# Patient Record
Sex: Female | Born: 1955
Health system: Southern US, Community
[De-identification: ages and names within clinical notes are randomized; demographics above are authoritative.]

## PROBLEM LIST (undated history)

## (undated) DIAGNOSIS — N949 Unspecified condition associated with female genital organs and menstrual cycle: Secondary | ICD-10-CM

## (undated) DIAGNOSIS — K259 Gastric ulcer, unspecified as acute or chronic, without hemorrhage or perforation: Secondary | ICD-10-CM

## (undated) DIAGNOSIS — M1990 Unspecified osteoarthritis, unspecified site: Secondary | ICD-10-CM

## (undated) DIAGNOSIS — K219 Gastro-esophageal reflux disease without esophagitis: Secondary | ICD-10-CM

## (undated) DIAGNOSIS — N925 Other specified irregular menstruation: Secondary | ICD-10-CM

## (undated) DIAGNOSIS — K449 Diaphragmatic hernia without obstruction or gangrene: Secondary | ICD-10-CM

## (undated) DIAGNOSIS — K222 Esophageal obstruction: Secondary | ICD-10-CM

## (undated) DIAGNOSIS — K589 Irritable bowel syndrome without diarrhea: Secondary | ICD-10-CM

## (undated) DIAGNOSIS — K52832 Lymphocytic colitis: Secondary | ICD-10-CM

## (undated) DIAGNOSIS — N938 Other specified abnormal uterine and vaginal bleeding: Secondary | ICD-10-CM

## (undated) DIAGNOSIS — R7303 Prediabetes: Secondary | ICD-10-CM

## (undated) DIAGNOSIS — N83209 Unspecified ovarian cyst, unspecified side: Secondary | ICD-10-CM

## (undated) DIAGNOSIS — E559 Vitamin D deficiency, unspecified: Secondary | ICD-10-CM

## (undated) DIAGNOSIS — J309 Allergic rhinitis, unspecified: Secondary | ICD-10-CM

## (undated) DIAGNOSIS — M722 Plantar fascial fibromatosis: Secondary | ICD-10-CM

## (undated) DIAGNOSIS — M549 Dorsalgia, unspecified: Secondary | ICD-10-CM

## (undated) HISTORY — DX: Unspecified condition associated with female genital organs and menstrual cycle: N94.9

## (undated) HISTORY — DX: Gastric ulcer, unspecified as acute or chronic, without hemorrhage or perforation: K25.9

## (undated) HISTORY — DX: Allergic rhinitis, unspecified: J30.9

## (undated) HISTORY — DX: Lymphocytic colitis: K52.832

## (undated) HISTORY — DX: Gastro-esophageal reflux disease without esophagitis: K21.9

## (undated) HISTORY — DX: Other specified irregular menstruation: N92.5

## (undated) HISTORY — DX: Unspecified osteoarthritis, unspecified site: M19.90

## (undated) HISTORY — DX: Plantar fascial fibromatosis: M72.2

## (undated) HISTORY — DX: Diaphragmatic hernia without obstruction or gangrene: K44.9

## (undated) HISTORY — DX: Esophageal obstruction: K22.2

## (undated) HISTORY — PX: BACK SURGERY: SHX140

## (undated) HISTORY — DX: Irritable bowel syndrome, unspecified: K58.9

## (undated) HISTORY — DX: Vitamin D deficiency, unspecified: E55.9

## (undated) HISTORY — PX: TONSILLECTOMY: SUR1361

## (undated) HISTORY — DX: Unspecified ovarian cyst, unspecified side: N83.209

## (undated) HISTORY — DX: Prediabetes: R73.03

## (undated) HISTORY — DX: Dorsalgia, unspecified: M54.9

## (undated) HISTORY — DX: Other specified abnormal uterine and vaginal bleeding: N93.8

---

## 1998-06-05 ENCOUNTER — Other Ambulatory Visit: Admission: RE | Admit: 1998-06-05 | Discharge: 1998-06-05 | Payer: Self-pay | Admitting: Obstetrics & Gynecology

## 1998-07-19 DIAGNOSIS — K449 Diaphragmatic hernia without obstruction or gangrene: Secondary | ICD-10-CM | POA: Insufficient documentation

## 1998-08-02 ENCOUNTER — Ambulatory Visit (HOSPITAL_COMMUNITY): Admission: EM | Admit: 1998-08-02 | Discharge: 1998-08-02 | Payer: Self-pay | Admitting: Emergency Medicine

## 1998-08-27 ENCOUNTER — Ambulatory Visit (HOSPITAL_COMMUNITY): Admission: RE | Admit: 1998-08-27 | Discharge: 1998-08-27 | Payer: Self-pay | Admitting: Gastroenterology

## 1998-08-27 ENCOUNTER — Encounter: Payer: Self-pay | Admitting: Gastroenterology

## 1998-08-27 DIAGNOSIS — Z8719 Personal history of other diseases of the digestive system: Secondary | ICD-10-CM | POA: Insufficient documentation

## 2001-11-28 ENCOUNTER — Other Ambulatory Visit: Admission: RE | Admit: 2001-11-28 | Discharge: 2001-11-28 | Payer: Self-pay | Admitting: Obstetrics and Gynecology

## 2004-06-11 ENCOUNTER — Encounter: Admission: RE | Admit: 2004-06-11 | Discharge: 2004-06-11 | Payer: Self-pay | Admitting: Family Medicine

## 2004-10-10 ENCOUNTER — Other Ambulatory Visit: Admission: RE | Admit: 2004-10-10 | Discharge: 2004-10-10 | Payer: Self-pay | Admitting: Family Medicine

## 2006-07-09 ENCOUNTER — Encounter: Admission: RE | Admit: 2006-07-09 | Discharge: 2006-07-09 | Payer: Self-pay | Admitting: Family Medicine

## 2006-07-09 DIAGNOSIS — N83209 Unspecified ovarian cyst, unspecified side: Secondary | ICD-10-CM | POA: Insufficient documentation

## 2006-07-28 ENCOUNTER — Ambulatory Visit (HOSPITAL_COMMUNITY): Admission: RE | Admit: 2006-07-28 | Discharge: 2006-07-28 | Payer: Self-pay | Admitting: Gastroenterology

## 2006-07-28 ENCOUNTER — Ambulatory Visit: Payer: Self-pay | Admitting: Gastroenterology

## 2006-07-28 DIAGNOSIS — K209 Esophagitis, unspecified without bleeding: Secondary | ICD-10-CM | POA: Insufficient documentation

## 2006-08-03 ENCOUNTER — Ambulatory Visit: Payer: Self-pay | Admitting: Gastroenterology

## 2006-08-19 ENCOUNTER — Ambulatory Visit (HOSPITAL_COMMUNITY): Admission: RE | Admit: 2006-08-19 | Discharge: 2006-08-19 | Payer: Self-pay | Admitting: Gastroenterology

## 2006-08-19 ENCOUNTER — Encounter: Payer: Self-pay | Admitting: Gastroenterology

## 2006-08-19 DIAGNOSIS — K222 Esophageal obstruction: Secondary | ICD-10-CM | POA: Insufficient documentation

## 2006-09-16 ENCOUNTER — Ambulatory Visit: Payer: Self-pay | Admitting: Gastroenterology

## 2007-08-29 ENCOUNTER — Telehealth (INDEPENDENT_AMBULATORY_CARE_PROVIDER_SITE_OTHER): Payer: Self-pay | Admitting: *Deleted

## 2007-08-29 ENCOUNTER — Encounter: Payer: Self-pay | Admitting: Gastroenterology

## 2007-09-24 DIAGNOSIS — J309 Allergic rhinitis, unspecified: Secondary | ICD-10-CM | POA: Insufficient documentation

## 2007-09-24 DIAGNOSIS — N949 Unspecified condition associated with female genital organs and menstrual cycle: Secondary | ICD-10-CM

## 2007-09-24 DIAGNOSIS — N938 Other specified abnormal uterine and vaginal bleeding: Secondary | ICD-10-CM | POA: Insufficient documentation

## 2007-09-24 DIAGNOSIS — E119 Type 2 diabetes mellitus without complications: Secondary | ICD-10-CM | POA: Insufficient documentation

## 2007-09-24 DIAGNOSIS — M722 Plantar fascial fibromatosis: Secondary | ICD-10-CM | POA: Insufficient documentation

## 2008-03-13 ENCOUNTER — Telehealth: Payer: Self-pay | Admitting: Gastroenterology

## 2008-03-14 ENCOUNTER — Telehealth: Payer: Self-pay | Admitting: Gastroenterology

## 2008-06-27 ENCOUNTER — Other Ambulatory Visit: Admission: RE | Admit: 2008-06-27 | Discharge: 2008-06-27 | Payer: Self-pay | Admitting: Family Medicine

## 2008-11-08 ENCOUNTER — Telehealth: Payer: Self-pay | Admitting: Gastroenterology

## 2008-12-25 ENCOUNTER — Emergency Department (HOSPITAL_COMMUNITY): Admission: EM | Admit: 2008-12-25 | Discharge: 2008-12-26 | Payer: Self-pay | Admitting: Emergency Medicine

## 2009-03-08 ENCOUNTER — Telehealth: Payer: Self-pay | Admitting: Gastroenterology

## 2009-04-26 ENCOUNTER — Ambulatory Visit: Payer: Self-pay | Admitting: Gastroenterology

## 2009-04-29 ENCOUNTER — Telehealth: Payer: Self-pay | Admitting: Gastroenterology

## 2009-05-01 ENCOUNTER — Telehealth: Payer: Self-pay | Admitting: Gastroenterology

## 2009-05-16 ENCOUNTER — Ambulatory Visit: Payer: Self-pay | Admitting: Gastroenterology

## 2009-06-10 ENCOUNTER — Telehealth: Payer: Self-pay | Admitting: Gastroenterology

## 2009-07-24 ENCOUNTER — Telehealth: Payer: Self-pay | Admitting: Gastroenterology

## 2009-07-25 ENCOUNTER — Encounter: Payer: Self-pay | Admitting: Gastroenterology

## 2009-07-29 ENCOUNTER — Encounter: Payer: Self-pay | Admitting: Gastroenterology

## 2009-08-21 ENCOUNTER — Telehealth: Payer: Self-pay | Admitting: Gastroenterology

## 2009-08-28 ENCOUNTER — Telehealth: Payer: Self-pay | Admitting: Gastroenterology

## 2009-12-19 ENCOUNTER — Encounter: Admission: RE | Admit: 2009-12-19 | Discharge: 2009-12-19 | Payer: Self-pay | Admitting: Family Medicine

## 2010-03-04 ENCOUNTER — Other Ambulatory Visit: Admission: RE | Admit: 2010-03-04 | Discharge: 2010-03-04 | Payer: Self-pay | Admitting: Family Medicine

## 2010-05-20 NOTE — Assessment & Plan Note (Signed)
Summary: routine check up GERD/med refills//dn    History of Present Illness Visit Type: Follow-up Visit Primary GI MD: Melvia Heaps MD [facility] Primary Provider: Shaune Pollack, MD Chief Complaint: Patient states she needs EGD w/dil & med refills History of Present Illness:   Tammy [name] has returned for reevaluation of dysphagia.  She is having progressive dysphagia to solids and liquids.  She has a history of an esophageal stricture which has been dilated several times in the past.  She is having breakthrough pyrosis as well.  She takes Nexium and, recently, ranitidine and omeprazole.   GI Review of Systems    Reports acid reflux.      Denies abdominal pain, belching, bloating, chest pain, dysphagia with liquids, dysphagia with solids, heartburn, loss of appetite, nausea, vomiting, vomiting blood, weight loss, and  weight gain.        Denies anal fissure, black tarry stools, change in bowel habit, constipation, diarrhea, diverticulosis, fecal incontinence, heme positive stool, hemorrhoids, irritable bowel syndrome, jaundice, light color stool, liver problems, rectal bleeding, and  rectal pain. Preventive Screening-Counseling & Management  Alcohol-Tobacco     Smoking Status: never      Drug Use:  no.      Current Medications (verified): 1)  Prilosec 20 Mg Cpdr (Omeprazole) .... Four Times A Day 2)  Claritin 10 Mg Tabs (Loratadine) .... At Bedtime 3)  Ranitidine Hcl 150 Mg Caps (Ranitidine Hcl) .... At Bedtime 4)  Tums Calcium For Life Bone 750 Mg Chew (Calcium Carbonate Antacid) .... Four Times A Day 5)  Ibuprofen 200 Mg Tabs (Ibuprofen) .... As Needed  Allergies (verified): 1)  ! Darvon 2)  ! Amoxicillin  Past History:  Past Medical History: Reviewed history from 09/24/2007 and no changes required. Current Problems:  SCHATZKI'S RING, HX OF (ICD-V12.79) ESOPHAGITIS (ICD-530.10) ESOPHAGEAL STRICTURE (ICD-530.3) HIATAL HERNIA (ICD-553.3) OVARIAN CYST, RIGHT  (ICD-620.2) DYSFUNCTIONAL UTERINE BLEEDING (ICD-626.8) PLANTAR FASCIITIS (ICD-728.71) ALLERGIC RHINITIS (ICD-477.9) DIABETES MELLITUS (ICD-250.00)  Past Surgical History: Reviewed history from 09/24/2007 and no changes required. Tonsillectomy (as child) Back Surgery (1996)  Family History: Family History of Diabetes:  Family History of Heart Disease: Mother  Social History: Occupation:[employer] Patient has never smoked.  Alcohol Use - no Daily Caffeine Use Illicit Drug Use - no Smoking Status:  never Drug Use:  no  Review of Systems       The patient complains of allergy/sinus, anxiety-new, arthritis/joint pain, back pain, change in vision, fatigue, swelling of feet/legs, and vision changes.         All other systems were reviewed and were negative   Vital Signs:  Patient profile:   55 year old female Height:      64.5 inches Weight:      257.38 pounds BMI:     43.65 Pulse rate:   68 / minute Pulse rhythm:   regular BP sitting:   120 / 78  (right arm) Cuff size:   large  Vitals Entered By: June McMurray CMA Duncan Dull) (April 26, 2009 2:55 PM)  Physical Exam  Additional Exam:  She is an obese female  skin: anicteric HEENT: normocephalic; PEERLA; no nasal or pharyngeal abnormalities neck: supple nodes: no cervical lymphadenopathy chest: clear to ausculatation and percussion heart: no murmurs, gallops, or rubs abd: soft, nontender; BS normoactive; no abdominal masses, tenderness, organomegaly rectal: deferred ext: no cynanosis, clubbing, edema skeletal: no deformities neuro: oriented x 3; no focal abnormalities    Impression & Recommendations:  Problem # 1:  ESOPHAGEAL STRICTURE (  ICD-530.3) Assessment Deteriorated  Plan repeat endoscopy with dilatation  Risks, alternatives, and complications of the procedure, including bleeding, perforation, and possible need for surgery, were explained to the patient.  Patient's questions were answered.  Orders: EGD  (EGD)  Problem # 2:  ESOPHAGITIS (ICD-530.10)  Plan to resume Nexium while discontinued ranitidine and omeprazole  Orders: EGD (EGD)  Problem # 3:  SPECIAL SCREENING FOR MALIGNANT NEOPLASMS COLON (ICD-V76.51)  Plan screening colonoscopy  Orders: Colonoscopy (Colon)  Patient Instructions: 1)  CC Dr. Shaune Pollack 2)  Your colonoscopy is scheduled for 05/16/2009 at 2;30PM 3)  You can pick up your MoviPrep from your pharmacy today 4)  Your EGD is scheduled 05/01/2009 at 10am 5)  Colonoscopy and Flexible Sigmoidoscopy brochure given.  6)  Conscious Sedation brochure given.  7)  Upper Endoscopy with Dilatation brochure given.  8)  The medication list was reviewed and reconciled.  All changed / newly prescribed medications were explained.  A complete medication list was provided to the patient / caregiver. Prescriptions: NEXIUM 40 MG  CPDR (ESOMEPRAZOLE MAGNESIUM) 1 capsule each day 30 minutes before meal  #30 x 10   Entered by:   Merri Ray CMA (AAMA)   Authorized by:   Louis Meckel MD   Signed by:   Merri Ray CMA (AAMA) on 04/26/2009   Method used:   Electronically to        [facility] [address] [phone]* (retail)       [address]       [place], [place]  [address]       Ph: [phone] or [phone]       Fax: [phone]   RxID:   [id number] NEXIUM 40 MG  CPDR (ESOMEPRAZOLE MAGNESIUM) 1 capsule each day 30 minutes before meal  #30 x 10   Entered and Authorized by:   Louis Meckel MD   Signed by:   Louis Meckel MD on 04/26/2009   Method used:   Historical   RxID:   [id number]

## 2010-05-20 NOTE — Procedures (Signed)
Summary: Upper Endoscopy  Patient: Tammy [name] Note: All result statuses are Final unless otherwise noted.  Tests: (1) Upper Endoscopy (EGD)   EGD Upper Endoscopy       DONE     [facility]     [address]     [place], [place]  [address]           ENDOSCOPY PROCEDURE REPORT           PATIENT:  Tammy [name], Tammy [name]  MR#:  [id number]     BIRTHDATE:  Aug 28, 1955, 53 yrs. old  GENDER:  female           ENDOSCOPIST:  Barbette Hair. Arlyce Dice, MD     Referred by:           PROCEDURE DATE:  05/16/2009     PROCEDURE:  EGD, diagnostic, Maloney Dilation of Esophagus     ASA CLASS:  Class II     INDICATIONS:  dysphagia           MEDICATIONS:   Fentanyl 75 mcg IV, Versed 8 mg IV, glycopyrrolate     (Robinal) 0.2 mg IV, 0.6cc simethancone 0.6 cc PO     TOPICAL ANESTHETIC:  Exactacain Spray           DESCRIPTION OF PROCEDURE:   After the risks benefits and     alternatives of the procedure were thoroughly explained, informed     consent was obtained.  The [facility] [id number] endoscope was     introduced through the mouth and advanced to the third portion of     the duodenum, without limitations.  The instrument was slowly     withdrawn as the mucosa was fully examined.     <<PROCEDUREIMAGES>>           A stricture was found at the gastroesophageal junction (see     image3). Dilation with maloney dilator 18mm Moderate resistance;     no heme  Otherwise the examination was normal.    Retroflexed     views revealed no abnormalities.    The scope was then withdrawn     from the patient and the procedure completed.           COMPLICATIONS:  None           ENDOSCOPIC IMPRESSION:     1) Stricture at the gastroesophageal junction - s/p dilitation     2) Otherwise normal examination     RECOMMENDATIONS:     1) Call office next 2-3 days to schedule an office appointment     for 1 month     2) trial of dexilant 60mg  qd           REPEAT EXAM:  No        ______________________________     Barbette Hair. Arlyce Dice, MD           CC:  Shaune Pollack, MD           n.     Rosalie Doctor:   Barbette Hair. Kaplan at 05/16/2009 02:55 PM           Zadie Cleverly, [id number]  Note: An exclamation mark (!) indicates a result that was not dispersed into the flowsheet. Document Creation Date: 05/16/2009 2:55 PM _______________________________________________________________________  (1) Order result status: Final Collection or observation date-time: 05/16/2009 14:51 Requested date-time:  Receipt date-time:  Reported date-time:  Referring Physician:   Ordering Physician: Melvia Heaps [phone]) Specimen Source:  Source: Launa Grill Order  Number: [id number] Lab site:

## 2010-05-20 NOTE — Progress Notes (Signed)
Summary: Canceled Appt.   Phone Note Call from Patient   Caller: Patient Call For: Dr. Arlyce Dice Summary of Call: Pt. rescheduled her EGD from 05-01-09 to 05-21-09 due to weather. Should this Pt. be charged cancelation fee? Initial call taken by: Karna Christmas,  May 01, 2009 10:27 AM  Follow-up for Phone Call        no Follow-up by: Louis Meckel MD,  May 01, 2009 11:03 AM

## 2010-05-20 NOTE — Progress Notes (Signed)
Summary: samples   Phone Note Call from Patient Call back at Home Phone [phone]   Caller: Patient Call For: Arlyce Dice Reason for Call: Refill Medication, Talk to Nurse Summary of Call: Patient wants to know if she can some more samples until she receives her meds. [facility]) Initial call taken by: Tawni Levy,  Aug 21, 2009 12:54 PM  Follow-up for Phone Call        Pt said that her Dexilant from [facility] has not arrived yet. Wants me to contact them and find out whats going on. Med sent to [facility] on April the 6th.  Follow-up by: Merri Ray CMA Duncan Dull),  Aug 21, 2009 1:07 PM     Appended Document: samples Contacted [facility] for pt, [phone]. Spoke with Marcina Millard , he stated the medication Dexilant on April 6th was stopped due to payment issue. he stated that pt needed to contact them at [phone] Left message to contact me when she was ready for me to send in a new prescription

## 2010-05-20 NOTE — Progress Notes (Signed)
Summary: Likes Dexilant she got samples of  Medications Added DEXILANT 60 MG CPDR (DEXLANSOPRAZOLE) 1 by mouth once daily 30 minutes before first meal       Phone Note Call from Patient Call back at Home Phone [phone]   Call For:  Dr Jarold Motto Reason for Call: Refill Medication Summary of Call: Got samples of Dexilant & likes it. Wonders if we can order some to [facility] will cover with a pre-auth done. Also, can she get more samples to hold her over until it arrives? Initial call taken by: Leanor Kail [facility],  June 10, 2009 11:32 AM  Follow-up for Phone Call        Called pt to inform se could come get samples sent in 90 day supply to [facility].  Follow-up by: Merri Ray CMA (AAMA),  June 10, 2009 1:19 PM    New/Updated Medications: DEXILANT 60 MG CPDR (DEXLANSOPRAZOLE) 1 by mouth once daily 30 minutes before first meal Prescriptions: DEXILANT 60 MG CPDR (DEXLANSOPRAZOLE) 1 by mouth once daily 30 minutes before first meal  #90 x 4   Entered by:   Merri Ray CMA (AAMA)   Authorized by:   Louis Meckel MD   Signed by:   Merri Ray CMA (AAMA) on 06/10/2009   Method used:   Electronically to        [facility]* (mail-order)             ,          Ph: [phone]       Fax: [phone]   RxID:   [id number]

## 2010-05-20 NOTE — Progress Notes (Signed)
Summary: needs rx  Medications Added DEXILANT 60 MG CPDR (DEXLANSOPRAZOLE) 1 by mouth once daily 30 minutes before first meal       Phone Note Call from Patient Call back at Home Phone [phone]   Caller: Patient Call For: Arlyce Dice Reason for Call: Talk to Nurse Summary of Call: Patient states that her mail in order for Dexilant was never received and she is out of medication. Initial call taken by: Tawni Levy,  July 24, 2009 8:36 AM  Follow-up for Phone Call        Samples left up front for pt to pick up and Rx was sent to pts mail order again.  Follow-up by: Christie Nottingham CMA Duncan Dull),  July 24, 2009 11:31 AM    New/Updated Medications: DEXILANT 60 MG CPDR (DEXLANSOPRAZOLE) 1 by mouth once daily 30 minutes before first meal Prescriptions: DEXILANT 60 MG CPDR (DEXLANSOPRAZOLE) 1 by mouth once daily 30 minutes before first meal  #90 x 3   Entered by:   Christie Nottingham CMA (AAMA)   Authorized by:   Louis Meckel MD   Signed by:   Christie Nottingham CMA (AAMA) on 07/24/2009   Method used:   Electronically to        [facility]* (mail-order)             ,          Ph: [phone]       Fax: [phone]   RxID:   [id number]

## 2010-05-20 NOTE — Medication Information (Signed)
Summary: Dexilant / [facility]  Dexilant / [facility]   Imported By: Lennie Odor 08/08/2009 14:52:12  _____________________________________________________________________  External Attachment:    Type:   Image     Comment:   External Document

## 2010-05-20 NOTE — Letter (Signed)
Summary: [facility] Instructions  [facility]  [address] [place], [place] [address]   Phone: [phone]  Fax: [phone]       Tammy [name]    1955-08-23    MRN: [id number]        Procedure Day /Date:THURSDAY 05/16/2009     Arrival Time:1:30PM     Procedure Time:2:30PM     Location of Procedure:                    X  [facility] (4th Floor)   PREPARATION FOR COLONOSCOPY WITH MOVIPREP   Starting 5 days prior to your procedure 05/11/2009 do not eat nuts, seeds, popcorn, corn, beans, peas,  salads, or any raw vegetables.  Do not take any fiber supplements (e.g. Metamucil, Citrucel, and Benefiber).  THE DAY BEFORE YOUR PROCEDURE         DATE: 05/15/2009  DAY:WED  1.  Drink clear liquids the entire day-NO SOLID FOOD  2.  Do not drink anything colored red or purple.  Avoid juices with pulp.  No orange juice.  3.  Drink at least 64 oz. (8 glasses) of fluid/clear liquids during the day to prevent dehydration and help the prep work efficiently.  CLEAR LIQUIDS INCLUDE: Water Jello Ice Popsicles Tea (sugar ok, no milk/cream) Powdered fruit flavored drinks Coffee (sugar ok, no milk/cream) Gatorade Juice: apple, white grape, white cranberry  Lemonade Clear bullion, consomm, broth Carbonated beverages (any kind) Strained chicken noodle soup Hard Candy                             4.  In the morning, mix first dose of MoviPrep solution:    Empty 1 Pouch A and 1 Pouch B into the disposable container    Add lukewarm drinking water to the top line of the container. Mix to dissolve    Refrigerate (mixed solution should be used within 24 hrs)  5.  Begin drinking the prep at 5:00 p.m. The MoviPrep container is divided by 4 marks.   Every 15 minutes drink the solution down to the next mark (approximately 8 oz) until the full liter is complete.   6.  Follow completed prep with 16 oz of clear liquid of your choice (Nothing red or purple).  Continue  to drink clear liquids until bedtime.  7.  Before going to bed, mix second dose of MoviPrep solution:    Empty 1 Pouch A and 1 Pouch B into the disposable container    Add lukewarm drinking water to the top line of the container. Mix to dissolve    Refrigerate  THE DAY OF YOUR PROCEDURE      DATE: 05/16/2009 DAY: THURSDAY  Beginning at 9:30a.m. (5 hours before procedure):         1. Every 15 minutes, drink the solution down to the next mark (approx 8 oz) until the full liter is complete.  2. Follow completed prep with 16 oz. of clear liquid of your choice.    3. You may drink clear liquids until 12:30PM (2 HOURS BEFORE PROCEDURE).   MEDICATION INSTRUCTIONS  Unless otherwise instructed, you should take regular prescription medications with a small sip of water   as early as possible the morning of your procedure.         OTHER INSTRUCTIONS  You will need a responsible adult at least 56 years of age to accompany you and drive  you home.   This person must remain in the waiting room during your procedure.  Wear loose fitting clothing that is easily removed.  Leave jewelry and other valuables at home.  However, you may wish to bring a book to read or  an iPod/MP3 player to listen to music as you wait for your procedure to start.  Remove all body piercing jewelry and leave at home.  Total time from sign-in until discharge is approximately 2-3 hours.  You should go home directly after your procedure and rest.  You can resume normal activities the  day after your procedure.  The day of your procedure you should not:   Drive   Make legal decisions   Operate machinery   Drink alcohol   Return to work  You will receive specific instructions about eating, activities and medications before you leave.    The above instructions have been reviewed and explained to me by   _______________________    I fully understand and can verbalize these instructions  _____________________________ Date _________

## 2010-05-20 NOTE — Letter (Signed)
Summary: EGD Instructions  [facility]  [address] [place], [place] [address]   Phone: [phone]  Fax: [phone]       MARJEAN IMPERATO    [birth year]    MRN: [id number]       Procedure Day /Date:WEDNESDAY 05/01/2009     Arrival Time: 9AM     Procedure Time:10AM     Location of Procedure:                    X [facility] (4th Floor)    PREPARATION FOR ENDOSCOPY/DIL   On 05/01/2009  THE DAY OF THE PROCEDURE:  1.   No solid foods, milk or milk products are allowed after midnight the night before your procedure.  2.   Do not drink anything colored red or purple.  Avoid juices with pulp.  No orange juice.  3.  You may drink clear liquids until 8:00AM  which is 2 hours before your procedure.                                                                                                CLEAR LIQUIDS INCLUDE: Water Jello Ice Popsicles Tea (sugar ok, no milk/cream) Powdered fruit flavored drinks Coffee (sugar ok, no milk/cream) Gatorade Juice: apple, white grape, white cranberry  Lemonade Clear bullion, consomm, broth Carbonated beverages (any kind) Strained chicken noodle soup Hard Candy   MEDICATION INSTRUCTIONS  Unless otherwise instructed, you should take regular prescription medications with a small sip of water as early as possible the morning of your procedure.               OTHER INSTRUCTIONS  You will need a responsible adult at least 56 years of age to accompany you and drive you home.   This person must remain in the waiting room during your procedure.  Wear loose fitting clothing that is easily removed.  Leave jewelry and other valuables at home.  However, you may wish to bring a book to read or an iPod/MP3 player to listen to music as you wait for your procedure to start.  Remove all body piercing jewelry and leave at home.  Total time from sign-in until discharge is approximately 2-3 hours.  You should go  home directly after your procedure and rest.  You can resume normal activities the day after your procedure.  The day of your procedure you should not:   Drive   Make legal decisions   Operate machinery   Drink alcohol   Return to work  You will receive specific instructions about eating, activities and medications before you leave.    The above instructions have been reviewed and explained to me by   _______________________    I fully understand and can verbalize these instructions _____________________________ Date _________

## 2010-05-20 NOTE — Medication Information (Signed)
Summary: Prior [employer] & Approved for Dexilant/[facility]  Prior Duard Brady & Approved for Dexilant/[facility]   Imported By: Sherian Rein 08/05/2009 07:53:09  _____________________________________________________________________  External Attachment:    Type:   Image     Comment:   External Document

## 2010-05-20 NOTE — Progress Notes (Signed)
Summary: [facility] is sending her her meds   Phone Note Call from Patient Call back at Home Phone [phone]   Call For: Dr Arlyce Dice Summary of Call: Talked to [facility], will be sending her the  prescriptions.  You do not need to call [facility] anymore or her, she just wanted to inform you. Initial call taken by: Leanor Kail [facility],  Aug 28, 2009 12:50 PM  Follow-up for Phone Call        Noted Follow-up by: Merri Ray CMA [facility]),  Aug 28, 2009 1:23 PM

## 2010-05-20 NOTE — Progress Notes (Signed)
Summary: 60m supply of Nexium to [facility]  Medications Added NEXIUM 40 MG  CPDR (ESOMEPRAZOLE MAGNESIUM) 1 capsule each day 30 minutes before meal       Phone Note Call from Patient Call back at Home Phone [phone]   Call For: DR [facility] Summary of Call: Nexium 39m supply sent to [facility] her  ID [id number] Initial call taken by: Leanor Kail [facility],  April 29, 2009 1:32 PM    New/Updated Medications: NEXIUM 40 MG  CPDR (ESOMEPRAZOLE MAGNESIUM) 1 capsule each day 30 minutes before meal Prescriptions: NEXIUM 40 MG  CPDR (ESOMEPRAZOLE MAGNESIUM) 1 capsule each day 30 minutes before meal  #90 x 4   Entered by:   Merri Ray CMA (AAMA)   Authorized by:   Louis Meckel MD   Signed by:   Merri Ray CMA (AAMA) on 04/29/2009   Method used:   Electronically to        [facility]* (mail-order)             ,          Ph: [phone]       Fax: [phone]   RxID:   [id number]   Appended Document: 54m supply of Nexium to [facility] L/M for pt that I sent in rx to [facility]

## 2010-07-21 ENCOUNTER — Other Ambulatory Visit: Payer: Self-pay | Admitting: Gastroenterology

## 2010-09-02 NOTE — Assessment & Plan Note (Signed)
[facility]                         GASTROENTEROLOGY OFFICE NOTE   NAME:[name], Tammy ZENKER                   MRN:          [id number]  DATE:09/16/2006                            DOB:          18-Aug-1955    PROBLEM:  Dysphagia.  [name] has returned following balloon  dilatation of her esophageus.  She has an esophageal stricture that was  dilated on 2 different occasions, beginning with a 12 mm balloon and  eventually up to an 18 mm balloon.  She no longer has dysphagia.  She  has no GI symptoms.   She remains on:  1. Nightly Zantac.  2. Nexium q. a.m.   EXAM:  Pulse 76, blood pressure 126/72, weight 270.   IMPRESSION:  Esophageal stricture - asymptomatic following dilatation  therapy.   RECOMMENDATIONS:  1. Continue Nexium while holding Zantac.  2. Screening colonoscopy at any point in the future.     Barbette Hair. Arlyce Dice, MD,FACG  Electronically Signed    RDK/MedQ  DD: 09/16/2006  DT: 09/16/2006  Job #: [id number]   cc:   Duncan Dull, M.D.

## 2010-09-05 NOTE — Assessment & Plan Note (Signed)
[facility]                         GASTROENTEROLOGY OFFICE NOTE   NAME:[name], [name]                   MRN:          [id number]  DATE:07/28/2006                            DOB:          06/07/1955    PROBLEM:  Dysphagia and food impaction.   Tammy [name] is a 55 year old white female, referred for evaluation of  dysphagia.  This has been a progressive problem for months.  She is  unable to sleep supine because of regurgitation of gastric contents and  pyrosis.  Last night, she was forced to vomit because she could not pass  food.  She has a history of esophageal stricture that was last dilated  in 2000.   PAST MEDICAL HISTORY:  Pertinent for borderline diabetes and arthritis.  She has had back surgery.   FAMILY HISTORY:  Noncontributory.   MEDICATIONS INCLUDE:  Over-the-counter Prilosec.   ALLERGIES:  She is allergic to AMOXICILLIN and DARVON>   SOCIAL HISTORY:  She neither smokes nor drinks.  She is married, works  as a Technical sales engineer.   REVIEW OF SYSTEMS:  Positive for joint pains, fatigue and back pain.   PHYSICAL EXAMINATION:  Pulse 70, blood pressure 120/80, weight 266.  HEENT: EOMI. PERRLA. Sclerae are anicteric.  Conjunctivae are pink.  NECK:  Supple without thyromegaly, adenopathy or carotid bruits.  CHEST:  She has a few expiratory wheezes.  Remainder of the exam is  normal.  CARDIAC:  Regular rhythm; normal S1 S2.  There are no murmurs, gallops  or rubs.  ABDOMEN:  Bowel sounds are normoactive.  Abdomen is soft, non-tender and  non-distended.  There are no abdominal masses, tenderness, splenic  enlargement or hepatomegaly.  EXTREMITIES:  Full range of motion.  No cyanosis, clubbing or edema.  RECTAL:  Deferred.   IMPRESSION:  1. Possible food impaction.  2. Peptic esophageal stricture.  3. GERD.   RECOMMENDATION:  1. Endoscopy with disimpaction as necessary.  If there are no acute      inflammatory changes, I      will  begin balloon dilatation.  2. Begin AcipHex at 20 mg a day.     Barbette Hair. Arlyce Dice, MD,FACG  Electronically Signed    RDK/MedQ  DD: 07/28/2006  DT: 07/28/2006  Job #: [id number]   cc:   Duncan Dull, M.D.

## 2010-09-11 ENCOUNTER — Telehealth: Payer: Self-pay | Admitting: Gastroenterology

## 2010-09-11 MED ORDER — DEXLANSOPRAZOLE 60 MG PO CPDR: 60.0000 mg | DELAYED_RELEASE_CAPSULE | Freq: Every day | ORAL | Status: DC

## 2010-09-11 NOTE — Telephone Encounter (Signed)
Pt states that she is "foaming at the mouth", she thinks she needs to have her esophagus stretched. Appt given for pt to see Dr. Arlyce Dice 09/16/10@8 :45am. Pt verbalized understanding. Pt also out of her Dexilant and requests a new rx and some samples.

## 2010-09-16 ENCOUNTER — Ambulatory Visit (INDEPENDENT_AMBULATORY_CARE_PROVIDER_SITE_OTHER): Payer: BC Managed Care – PPO | Admitting: Gastroenterology

## 2010-09-16 ENCOUNTER — Encounter: Payer: Self-pay | Admitting: Gastroenterology

## 2010-09-16 DIAGNOSIS — Z1211 Encounter for screening for malignant neoplasm of colon: Secondary | ICD-10-CM

## 2010-09-16 DIAGNOSIS — K219 Gastro-esophageal reflux disease without esophagitis: Secondary | ICD-10-CM

## 2010-09-16 MED ORDER — OMEPRAZOLE-SODIUM BICARBONATE 40-1100 MG PO CAPS: 1.0000 | ORAL_CAPSULE | Freq: Every day | ORAL | Status: DC

## 2010-09-16 NOTE — Patient Instructions (Addendum)
We are sending in a new prescription to your pharmacy.  If not improved over the next 2-3 weeks please schedule a return office visit.   Gastroesophageal Reflux Disease (GERD) Your caregiver has diagnosed your chest discomfort as caused by gastroesophageal reflux disease (GERD). GERD is caused by a reflux of acid from your stomach into the digestive tube between your mouth and stomach (esophagus). Acid in contact with the esophagus causes soreness (inflammation) resulting in heartburn or chest pain. It may cause small holes in the lining of the esophagus (ulcers). CAUSES  Increased body weight puts pressure on the stomach, making acid rise.   Smoking increases acid production.   Alcohol decreases pressure on the valve between the stomach and esophagus (lower esophageal sphincter), allowing acid from the stomach into the esophagus.   Late evening meals and a full stomach increase pressure and acid production.   Lower esophageal sphincter is malformed.   Sometimes, no reason is found.  HOME CARE INSTRUCTIONS  Change the factors that you can control. Weight, smoking, or alcohol changes may be difficult to change on your own. Your caregiver can provide guidance and medical therapy.   Raising the head of your bed may help you to sleep.   Over-the-counter medicines will decrease acid production. Your caregiver can also prescribe medicines for this. Only take over-the-counter or prescription medicines for pain, discomfort, or fever as directed by your caregiver.   1/2 to 1 teaspoon of an antacid taken every hour while awake, with meals, and at bedtime, can neutralize acid.   DO NOT take aspirin, ibuprofen, or other nonsteroidal anti-inflammatory drugs (NSAIDs).  SEEK IMMEDIATE MEDICAL CARE IF:  The pain changes in location (radiates into arms, neck, jaw, teeth, or back), intensity, or duration.   You start feeling sick to your stomach (nauseous), start throwing up (vomiting), or sweating  (diaphoresis).   You develop left arm or jaw pain.   You develop pain going into your back, shortness of breath, or you pass out.   There is vomiting of fluid that is green, yellow, or looks like coffee grounds or blood.  These symptoms could signal other problems, such as heart disease. MAKE SURE YOU:  Understand these instructions.   Will watch your condition.   Will get help right away if you are not doing well or get worse.  Document Released: 01/14/2005 Document Re-Released: 07/01/2009 [facility] Patient Information 2011 [place], [place].

## 2010-09-16 NOTE — Assessment & Plan Note (Signed)
The patient will be scheduled for screening colonoscopy.

## 2010-09-16 NOTE — Assessment & Plan Note (Addendum)
Patient's symptoms are likely related to nocturnal GERD.  Medications #1 D/C dexilant #2 trial of Zegerid 40 mg each bedtime

## 2010-09-16 NOTE — Progress Notes (Signed)
History of Present Illness:  Tammy [name] is a 55 year old white female with a history of esophageal stricture and diabetes here for evaluation of regurgitation of gastric contents. This occurs primarily after retiring. She sits in a recliner. She denies dysphagia, per se or frank pyrosis. She is in no gastric irritants.    Review of Systems: Pertinent positive and negative review of systems were noted in the above HPI section. All other review of systems were otherwise negative.    Current Medications, Allergies, Past Medical History, Past Surgical History, Family History and Social History were reviewed in [employer] electronic medical record  Vital signs were reviewed in today's medical record. Physical Exam: General: Well developed , well nourished, no acute distress Head: Normocephalic and atraumatic Eyes:  sclerae anicteric, EOMI Ears: Normal auditory acuity Mouth: No deformity or lesions Lungs: There are a few inspiratory and expiratory wheezes Heart: Regular rate and rhythm; no murmurs, rubs or bruits Abdomen: Soft, non tender and non distended. No masses, hepatosplenomegaly or hernias noted. Normal Bowel sounds Rectal:deferred Musculoskeletal: Symmetrical with no gross deformities  Pulses:  Normal pulses noted Extremities: No clubbing, cyanosis, edema or deformities noted Neurological: Alert oriented x 4, grossly nonfocal Psychological:  Alert and cooperative. Normal mood and affect

## 2010-09-17 ENCOUNTER — Telehealth: Payer: Self-pay | Admitting: Gastroenterology

## 2010-09-17 NOTE — Telephone Encounter (Signed)
She can take the medicine, ideally when she's not drinking mild.  OK despite wheezing

## 2010-09-17 NOTE — Telephone Encounter (Signed)
Pt aware of Dr. Marzetta Board recommendations. Pt wants to know what she should take for the wheezing. Let pt know that I would ask Dr. Arlyce Dice for his suggestion but I thought she would need to call her PCP for that. Dr. Arlyce Dice please advise.

## 2010-09-17 NOTE — Telephone Encounter (Signed)
Dr Arlyce Dice, Pt stated she got her Zegerid and she has a concern with her drinking milk with the medication. It states on the prescription that she shouldn't drink milk with it. Also she wanted to let us know she was wheezing and the med states dont take if you are wheezing.. Can she still take the medication.. Its Zegerid

## 2010-09-18 NOTE — Telephone Encounter (Signed)
She needs to talk with her PCP

## 2010-09-18 NOTE — Telephone Encounter (Signed)
Pt aware of Dr. Kaplan's recommendations. 

## 2010-12-17 ENCOUNTER — Other Ambulatory Visit: Payer: Self-pay | Admitting: Gastroenterology

## 2011-03-05 ENCOUNTER — Other Ambulatory Visit: Payer: Self-pay | Admitting: Gastroenterology

## 2011-04-10 ENCOUNTER — Other Ambulatory Visit: Payer: Self-pay | Admitting: Gastroenterology

## 2011-04-10 NOTE — Telephone Encounter (Signed)
Informed patient that we do not have samples of Zegerid but she can purchase Zegerid OTC

## 2011-09-09 ENCOUNTER — Ambulatory Visit: Payer: Worker's Compensation | Attending: Pain Medicine

## 2011-09-09 DIAGNOSIS — M25669 Stiffness of unspecified knee, not elsewhere classified: Secondary | ICD-10-CM | POA: Insufficient documentation

## 2011-09-09 DIAGNOSIS — M25569 Pain in unspecified knee: Secondary | ICD-10-CM | POA: Insufficient documentation

## 2011-09-09 DIAGNOSIS — R262 Difficulty in walking, not elsewhere classified: Secondary | ICD-10-CM | POA: Insufficient documentation

## 2011-09-09 DIAGNOSIS — IMO0001 Reserved for inherently not codable concepts without codable children: Secondary | ICD-10-CM | POA: Insufficient documentation

## 2011-09-15 ENCOUNTER — Ambulatory Visit: Payer: Worker's Compensation | Attending: Pain Medicine

## 2011-09-15 DIAGNOSIS — R262 Difficulty in walking, not elsewhere classified: Secondary | ICD-10-CM | POA: Insufficient documentation

## 2011-09-15 DIAGNOSIS — IMO0001 Reserved for inherently not codable concepts without codable children: Secondary | ICD-10-CM | POA: Insufficient documentation

## 2011-09-15 DIAGNOSIS — M25569 Pain in unspecified knee: Secondary | ICD-10-CM | POA: Insufficient documentation

## 2011-09-15 DIAGNOSIS — M25669 Stiffness of unspecified knee, not elsewhere classified: Secondary | ICD-10-CM | POA: Insufficient documentation

## 2011-09-17 ENCOUNTER — Ambulatory Visit: Payer: Worker's Compensation | Attending: Pain Medicine

## 2011-09-17 DIAGNOSIS — IMO0001 Reserved for inherently not codable concepts without codable children: Secondary | ICD-10-CM | POA: Insufficient documentation

## 2011-09-17 DIAGNOSIS — M25569 Pain in unspecified knee: Secondary | ICD-10-CM | POA: Insufficient documentation

## 2011-09-17 DIAGNOSIS — M25669 Stiffness of unspecified knee, not elsewhere classified: Secondary | ICD-10-CM | POA: Insufficient documentation

## 2011-09-17 DIAGNOSIS — R262 Difficulty in walking, not elsewhere classified: Secondary | ICD-10-CM | POA: Insufficient documentation

## 2011-09-28 ENCOUNTER — Ambulatory Visit: Payer: Worker's Compensation | Attending: Pain Medicine

## 2011-09-28 DIAGNOSIS — R262 Difficulty in walking, not elsewhere classified: Secondary | ICD-10-CM | POA: Insufficient documentation

## 2011-09-28 DIAGNOSIS — M25569 Pain in unspecified knee: Secondary | ICD-10-CM | POA: Insufficient documentation

## 2011-09-28 DIAGNOSIS — IMO0001 Reserved for inherently not codable concepts without codable children: Secondary | ICD-10-CM | POA: Insufficient documentation

## 2011-09-28 DIAGNOSIS — M25669 Stiffness of unspecified knee, not elsewhere classified: Secondary | ICD-10-CM | POA: Insufficient documentation

## 2011-10-06 ENCOUNTER — Ambulatory Visit: Payer: Worker's Compensation | Attending: Pain Medicine

## 2011-10-06 DIAGNOSIS — IMO0001 Reserved for inherently not codable concepts without codable children: Secondary | ICD-10-CM | POA: Insufficient documentation

## 2011-10-06 DIAGNOSIS — R262 Difficulty in walking, not elsewhere classified: Secondary | ICD-10-CM | POA: Insufficient documentation

## 2011-10-06 DIAGNOSIS — M25569 Pain in unspecified knee: Secondary | ICD-10-CM | POA: Insufficient documentation

## 2011-10-06 DIAGNOSIS — M25669 Stiffness of unspecified knee, not elsewhere classified: Secondary | ICD-10-CM | POA: Insufficient documentation

## 2011-10-20 ENCOUNTER — Ambulatory Visit: Payer: Worker's Compensation

## 2012-02-19 HISTORY — PX: KNEE SURGERY: SHX244

## 2012-09-30 ENCOUNTER — Other Ambulatory Visit: Payer: Self-pay

## 2012-09-30 DIAGNOSIS — Z1231 Encounter for screening mammogram for malignant neoplasm of breast: Secondary | ICD-10-CM

## 2012-10-25 ENCOUNTER — Ambulatory Visit
Admission: RE | Admit: 2012-10-25 | Discharge: 2012-10-25 | Disposition: A | Discharge status: Home / Self Care | Payer: BC Managed Care – PPO | Source: Ambulatory Visit

## 2012-10-25 ENCOUNTER — Ambulatory Visit: Payer: Worker's Compensation

## 2012-10-25 DIAGNOSIS — Z1231 Encounter for screening mammogram for malignant neoplasm of breast: Secondary | ICD-10-CM

## 2013-04-05 ENCOUNTER — Other Ambulatory Visit: Payer: Self-pay | Admitting: Family Medicine

## 2013-04-05 ENCOUNTER — Other Ambulatory Visit (HOSPITAL_COMMUNITY)
Admission: RE | Admit: 2013-04-05 | Discharge: 2013-04-05 | Disposition: A | Discharge status: Home / Self Care | Payer: BC Managed Care – PPO | Source: Ambulatory Visit | Attending: Family Medicine | Admitting: Family Medicine

## 2013-04-05 DIAGNOSIS — Z124 Encounter for screening for malignant neoplasm of cervix: Secondary | ICD-10-CM | POA: Insufficient documentation

## 2014-01-08 ENCOUNTER — Telehealth: Payer: Self-pay | Admitting: Gastroenterology

## 2014-01-08 NOTE — Telephone Encounter (Signed)
Reports " couple of days of diarrhea" and notes the stool appeared black. Now having regular bm's but she can see blood in the bm. She is not in pain and does not have nausea. Appointment scheduled for evaluation. She had contacted her PCP who had advised she call here.

## 2014-01-15 ENCOUNTER — Ambulatory Visit (INDEPENDENT_AMBULATORY_CARE_PROVIDER_SITE_OTHER): Payer: BC Managed Care – PPO | Admitting: Nurse Practitioner

## 2014-01-15 ENCOUNTER — Encounter: Payer: Self-pay | Admitting: Nurse Practitioner

## 2014-01-15 VITALS — BP 122/82 | HR 64 | Ht 64.5 in | Wt 261.1 lb

## 2014-01-15 DIAGNOSIS — K625 Hemorrhage of anus and rectum: Secondary | ICD-10-CM

## 2014-01-15 DIAGNOSIS — K921 Melena: Secondary | ICD-10-CM

## 2014-01-15 DIAGNOSIS — R195 Other fecal abnormalities: Secondary | ICD-10-CM

## 2014-01-15 MED ORDER — HYDROCORTISONE ACETATE 25 MG RE SUPP: 25.0000 mg | Freq: Every day | RECTAL | Status: DC

## 2014-01-15 NOTE — Patient Instructions (Addendum)
You have been scheduled for an endoscopy at [facility] with Dr. Arlyce Dice. Please follow written instructions given to you at your visit today. If you use inhalers (even only as needed), please bring them with you on the day of your procedure. Your physician has requested that you go to www.startemmi.com and enter the access code given to you at your visit today. This web site gives a general overview about your procedure. However, you should still follow specific instructions given to you by our office regarding your preparation for the procedure.  Please call us back to let us know about your Colonoscopy or if we need to set you up with pre-visit to schedule a colonoscopy.  We have sent the following medications to your pharmacy for you to pick up at your convenience: Anusol Suppositories, please place one suppository rectally at bedtime

## 2014-01-15 NOTE — Progress Notes (Signed)
HPI :  Patient is a 58 year old female known remotely to Dr. Arlyce Dice for history of esophageal strictures. Patient comes in today for evaluation of black tarry stools. Two weeks ago patient experienced 2 days of liquid black stools. No associated nausea or abdominal pain. She had not been taking bismuth nor iron. Stools have slowly begun to normalize in consistency and color but now patient is having some bright red blood per rectum. Patient is on Celebrex, she also takes BC powders and ibuprofen. She is on chronic PPI therapy. Patient feels okay, no dizziness, shortness of breath. She is a little more fatigued than normal. Hgb at PCP's office last week was 12.9.    Past Medical History  Diagnosis Date  . Type II or unspecified type diabetes mellitus without mention of complication, not stated as uncontrolled     diet control  . Allergic rhinitis, cause unspecified   . Plantar fascial fibromatosis   . Other disorder of menstruation and other abnormal bleeding from female genital tract   . Other and unspecified ovarian cyst   . Diaphragmatic hernia without mention of obstruction or gangrene   . Stricture and stenosis of esophagus   . Schatzki's ring   . GERD (gastroesophageal reflux disease)     Family History  Problem Relation Age of Onset  . Colon cancer Neg Hx    History  Substance Use Topics  . Smoking status: Never Smoker   . Smokeless tobacco: Never Used  . Alcohol Use: No   Current Outpatient Prescriptions  Medication Sig Dispense Refill  . Acetaminophen (TYLENOL EXTRA STRENGTH PO) Take by mouth. Take 1 tablet 4 days a week      . Aspirin-Salicylamide-Caffeine (BC HEADACHE POWDER PO) Take by mouth. As needed       . celecoxib (CELEBREX) 200 MG capsule Take 200 mg by mouth daily.      . Glucosamine-Chondroitin (COSAMIN DS PO) Take 1 tablet by mouth daily.      [name] ibuprofen (ADVIL,MOTRIN) 200 MG tablet Take 200 mg by mouth. Takes 400-600 mg/ 4 days week      . loratadine  (CLARITIN) 10 MG tablet Take 10 mg by mouth daily.        . Multiple Vitamins-Minerals (MULTIVITAMIN PO) Take 1 tablet by mouth daily. Women's Centrum Silver      . omeprazole-sodium bicarbonate (ZEGERID) 40-1100 MG per capsule TAKE 1 CAPSULE BY MOUTH EVERY NIGHT AT BEDTIME; Takes OTC; Taking 1300 mg      . hydrocortisone (ANUSOL-HC) 25 MG suppository Place 1 suppository (25 mg total) rectally at bedtime.  24 suppository  1   No current facility-administered medications for this visit.   Allergies  Allergen Reactions  . Amoxicillin   . Propoxyphene Hcl     Review of Systems: All systems reviewed and negative except where noted in HPI.   Physical Exam: BP 122/82  Pulse 64  Ht 5' 4.5" (1.638 m)  Wt 261 lb 2 oz (118.446 kg)  BMI 44.15 kg/m2 Constitutional: Pleasant,well-developed, white female in no acute distress. HEENT: Normocephalic and atraumatic. Conjunctivae are normal. No scleral icterus. Neck supple.  Cardiovascular: Normal rate, regular rhythm.  Pulmonary/chest: Effort normal and breath sounds normal. No wheezing, rales or rhonchi. Abdominal: Soft, nondistended, nontender. Bowel sounds active throughout. There are no masses palpable. No hepatomegaly. Rectal: Light brown, Hemoccult negative stools. Some external hemorrhoids present. Extremities: no edema Lymphadenopathy: No cervical adenopathy noted. Neurological: Alert and oriented to person place and time. Skin: Skin is  warm and dry. No rashes noted. Psychiatric: Normal mood and affect. Behavior is normal.   ASSESSMENT AND PLAN:   58 year old female with GI bleed in setting of NSAID use. Black stools have now resolved.  Hemoglobin at PCPs office 01/05/14 was 12.9. Patient is hemodynamically stable, feels okay except for fatigue. Stools are light brown and Hemoccult-negative.   Patient will need upper endoscopy for further evaluation.The benefits, risks, and potential complications of EGD with possible biopsies were  discussed with the patient and she agrees to proceed.    Patient agrees to hold all anti-inflammatories for now and continue PPI therapy.   Patient will call our ASAP if she has recurrent black stools before time of  EGD.   2. Rectal bleeding, recent onset. Probably unrelated to #1 and secondary to hemorrhoids.   Will try Anusol-HC suppositories for one week.   Patient states she had a colonoscopy here in 2008 but we are unable to find any documentation of that  She will investigate further and call us for clarification.  If bleeding persists or recurs she will need repeat colonoscopy. If patient did not have a colonoscopy then she will definitely need one for further evaluation.

## 2014-01-16 ENCOUNTER — Encounter (HOSPITAL_COMMUNITY): Payer: Self-pay | Admitting: Pharmacy Technician

## 2014-01-16 ENCOUNTER — Encounter (HOSPITAL_COMMUNITY): Payer: Self-pay | Admitting: *Deleted

## 2014-01-16 DIAGNOSIS — K921 Melena: Secondary | ICD-10-CM | POA: Insufficient documentation

## 2014-01-16 DIAGNOSIS — K625 Hemorrhage of anus and rectum: Secondary | ICD-10-CM | POA: Insufficient documentation

## 2014-01-17 ENCOUNTER — Telehealth: Payer: Self-pay | Admitting: Nurse Practitioner

## 2014-01-17 NOTE — Progress Notes (Signed)
Reviewed and agree with management. Robert D. Kaplan, M.D., FACG  

## 2014-01-17 NOTE — Telephone Encounter (Signed)
Spoke with patient and she states she had a colonoscopy in April 2008 with Dr. Russella DarStark.

## 2014-01-22 ENCOUNTER — Encounter: Payer: Self-pay | Admitting: Gastroenterology

## 2014-01-25 NOTE — Telephone Encounter (Signed)
Okay. Will check our records again.

## 2014-01-28 NOTE — Anesthesia Preprocedure Evaluation (Signed)
Anesthesia Evaluation  Patient identified by MRN, date of birth, ID band Patient awake    Reviewed: Allergy & Precautions, H&P , NPO status , Patient's Chart, lab work & pertinent test results  Airway Mallampati: II TM Distance: >3 FB Neck ROM: Full    Dental no notable dental hx.    Pulmonary neg pulmonary ROS,  breath sounds clear to auscultation  Pulmonary exam normal       Cardiovascular negative cardio ROS  Rhythm:Regular Rate:Normal     Neuro/Psych negative neurological ROS  negative psych ROS   GI/Hepatic Neg liver ROS, GERD-  Medicated and Controlled,  Endo/Other  diabetes, Type 2  Renal/GU negative Renal ROS  negative genitourinary   Musculoskeletal negative musculoskeletal ROS (+)   Abdominal   Peds negative pediatric ROS (+)  Hematology negative hematology ROS (+)   Anesthesia Other Findings   Reproductive/Obstetrics negative OB ROS                           Anesthesia Physical Anesthesia Plan  ASA: II  Anesthesia Plan: MAC   Post-op Pain Management:    Induction: Intravenous  Airway Management Planned: Nasal Cannula  Additional Equipment:   Intra-op Plan:   Post-operative Plan: Extubation in OR  Informed Consent: I have reviewed the patients History and Physical, chart, labs and discussed the procedure including the risks, benefits and alternatives for the proposed anesthesia with the patient or authorized representative who has indicated his/her understanding and acceptance.   Dental advisory given  Plan Discussed with: CRNA  Anesthesia Plan Comments:         Anesthesia Quick Evaluation

## 2014-01-29 ENCOUNTER — Encounter (HOSPITAL_COMMUNITY): Payer: BC Managed Care – PPO | Admitting: Certified Registered Nurse Anesthetist

## 2014-01-29 ENCOUNTER — Ambulatory Visit (HOSPITAL_COMMUNITY)
Admission: RE | Admit: 2014-01-29 | Discharge: 2014-01-29 | Disposition: A | Discharge status: Home / Self Care | Payer: BC Managed Care – PPO | Source: Ambulatory Visit | Attending: Gastroenterology | Admitting: Gastroenterology

## 2014-01-29 ENCOUNTER — Ambulatory Visit (HOSPITAL_COMMUNITY): Payer: BC Managed Care – PPO | Admitting: Certified Registered Nurse Anesthetist

## 2014-01-29 ENCOUNTER — Encounter (HOSPITAL_COMMUNITY): Payer: Self-pay | Admitting: *Deleted

## 2014-01-29 ENCOUNTER — Encounter (HOSPITAL_COMMUNITY)
Admission: RE | Disposition: A | Discharge status: Home / Self Care | Payer: Self-pay | Source: Ambulatory Visit | Attending: Gastroenterology

## 2014-01-29 DIAGNOSIS — Z880 Allergy status to penicillin: Secondary | ICD-10-CM | POA: Insufficient documentation

## 2014-01-29 DIAGNOSIS — K921 Melena: Secondary | ICD-10-CM

## 2014-01-29 DIAGNOSIS — K219 Gastro-esophageal reflux disease without esophagitis: Secondary | ICD-10-CM | POA: Diagnosis not present

## 2014-01-29 DIAGNOSIS — Z888 Allergy status to other drugs, medicaments and biological substances status: Secondary | ICD-10-CM | POA: Diagnosis not present

## 2014-01-29 DIAGNOSIS — E119 Type 2 diabetes mellitus without complications: Secondary | ICD-10-CM | POA: Diagnosis not present

## 2014-01-29 DIAGNOSIS — Z881 Allergy status to other antibiotic agents status: Secondary | ICD-10-CM | POA: Insufficient documentation

## 2014-01-29 HISTORY — PX: ESOPHAGOGASTRODUODENOSCOPY (EGD) WITH PROPOFOL: SHX5813

## 2014-01-29 SURGERY — ESOPHAGOGASTRODUODENOSCOPY (EGD) WITH PROPOFOL
Anesthesia: Monitor Anesthesia Care

## 2014-01-29 MED ORDER — ONDANSETRON HCL 4 MG/2ML IJ SOLN
INTRAMUSCULAR | Status: DC | PRN
  Administered 2014-01-29: 4 mg via INTRAVENOUS

## 2014-01-29 MED ORDER — KETAMINE HCL 10 MG/ML IJ SOLN
INTRAMUSCULAR | Status: DC | PRN
  Administered 2014-01-29: 20 mg via INTRAVENOUS

## 2014-01-29 MED ORDER — PROMETHAZINE HCL 25 MG/ML IJ SOLN: 6.2500 mg | INTRAMUSCULAR | Status: DC | PRN

## 2014-01-29 MED ORDER — PROPOFOL 10 MG/ML IV BOLUS
INTRAVENOUS | Status: AC
  Filled 2014-01-29: qty 20

## 2014-01-29 MED ORDER — SODIUM CHLORIDE 0.9 % IJ SOLN
INTRAMUSCULAR | Status: AC
  Filled 2014-01-29: qty 10

## 2014-01-29 MED ORDER — LIDOCAINE HCL (CARDIAC) 20 MG/ML IV SOLN
INTRAVENOUS | Status: AC
  Filled 2014-01-29: qty 5

## 2014-01-29 MED ORDER — LACTATED RINGERS IV SOLN
INTRAVENOUS | Status: DC
  Administered 2014-01-29: 1000 mL via INTRAVENOUS

## 2014-01-29 MED ORDER — PROPOFOL 10 MG/ML IV BOLUS
INTRAVENOUS | Status: AC
  Filled 2014-01-29: qty 40

## 2014-01-29 MED ORDER — PROPOFOL 10 MG/ML IV BOLUS
INTRAVENOUS | Status: DC | PRN
  Administered 2014-01-29 (×2): 20 mg via INTRAVENOUS
  Administered 2014-01-29: 40 mg via INTRAVENOUS

## 2014-01-29 MED ORDER — SODIUM CHLORIDE 0.9 % IV SOLN: INTRAVENOUS | Status: DC

## 2014-01-29 MED ORDER — LIDOCAINE HCL (CARDIAC) 20 MG/ML IV SOLN
INTRAVENOUS | Status: DC | PRN
  Administered 2014-01-29: 100 mg via INTRAVENOUS

## 2014-01-29 MED ORDER — EPHEDRINE SULFATE 50 MG/ML IJ SOLN
INTRAMUSCULAR | Status: AC
  Filled 2014-01-29: qty 1

## 2014-01-29 MED ORDER — ONDANSETRON HCL 4 MG/2ML IJ SOLN
INTRAMUSCULAR | Status: AC
  Filled 2014-01-29: qty 2

## 2014-01-29 MED ORDER — PROPOFOL INFUSION 10 MG/ML OPTIME
INTRAVENOUS | Status: DC | PRN
  Administered 2014-01-29: 140 ug/kg/min via INTRAVENOUS

## 2014-01-29 MED ORDER — ATROPINE SULFATE 0.4 MG/ML IJ SOLN
INTRAMUSCULAR | Status: AC
  Filled 2014-01-29: qty 1

## 2014-01-29 SURGICAL SUPPLY — 14 items

## 2014-01-29 NOTE — Op Note (Signed)
[facility] [address] [place], [id number]   ENDOSCOPY PROCEDURE REPORT  PATIENT: Tammy [name]  MR#: [id number] BIRTHDATE: 02-Dec-1955 , 58  yrs. old GENDER: female ENDOSCOPIST: Louis Meckelobert D Kaplan, MD REFERRED BY:  Shaune Pollackonna Gates, M.D. PROCEDURE DATE:  01/29/2014 PROCEDURE:  EGD, diagnostic ASA CLASS:     Class II INDICATIONS:   melena. MEDICATIONS: Monitored anesthesia care TOPICAL ANESTHETIC:  DESCRIPTION OF PROCEDURE: After the risks benefits and alternatives of the procedure were thoroughly explained, informed consent was obtained.  The [id number] ([id number] endoscope was introduced through the mouth and advanced to the third portion of the duodenum , Without limitations.  The instrument was slowly withdrawn as the mucosa was fully examined.      EXAM: The esophagus and gastroesophageal junction were completely normal in appearance.  The stomach was entered and closely examined.The antrum, angularis, and lesser curvature were well visualized, including a retroflexed view of the cardia and fundus. The stomach wall was normally distensable.  The scope passed easily through the pylorus into the duodenum.  Retroflexed views revealed no abnormalities.     The scope was then withdrawn from the patient and the procedure completed.  COMPLICATIONS: There were no immediate complications.  ENDOSCOPIC IMPRESSION: Normal appearing esophagus and GE junction, the stomach was well visualized and normal in appearance, normal appearing duodenum  RECOMMENDATIONS: My office will schedule a colonoscopy  for workup a history of melena and rectal bleeding  REPEAT EXAM:  eSigned:  Louis Meckelobert D Kaplan, MD 01/29/2014 11:37 AM    CC:

## 2014-01-29 NOTE — Discharge Instructions (Signed)
Gastrointestinal Endoscopy, Care After °Refer to this sheet in the next few weeks. These instructions provide you with information on caring for yourself after your procedure. Your caregiver may also give you more specific instructions. Your treatment has been planned according to current medical practices, but problems sometimes occur. Call your caregiver if you have any problems or questions after your procedure. °HOME CARE INSTRUCTIONS °· If you were given medicine to help you relax (sedative), do not drive, operate machinery, or sign important documents for 24 hours. °· Avoid alcohol and hot or warm beverages for the first 24 hours after the procedure. °· Only take over-the-counter or prescription medicines for pain, discomfort, or fever as directed by your caregiver. You may resume taking your normal medicines unless your caregiver tells you otherwise. Ask your caregiver when you may resume taking medicines that may cause bleeding, such as aspirin, clopidogrel, or warfarin. °· You may return to your normal diet and activities on the day after your procedure, or as directed by your caregiver. Walking may help to reduce any bloated feeling in your abdomen. °· Drink enough fluids to keep your urine clear or pale yellow. °· You may gargle with salt water if you have a sore throat. °SEEK IMMEDIATE MEDICAL CARE IF: °· You have severe nausea or vomiting. °· You have severe abdominal pain, abdominal cramps that last longer than 6 hours, or abdominal swelling (distention). °· You have severe shoulder or back pain. °· You have trouble swallowing. °· You have shortness of breath, your breathing is shallow, or you are breathing faster than normal. °· You have a fever or a rapid heartbeat. °· You vomit blood or material that looks like coffee grounds. °· You have bloody, black, or tarry stools. °MAKE SURE YOU: °· Understand these instructions. °· Will watch your condition. °· Will get help right away if you are not doing  well or get worse. °Document Released: 11/19/2003 Document Revised: 08/21/2013 Document Reviewed: 07/07/2011 °Exit[employer]® Patient Information ©2015 [employer]. This information is not intended to replace advice given to you by your health care provider. Make sure you discuss any questions you have with your health care provider. ° °

## 2014-01-29 NOTE — Transfer of Care (Signed)
Immediate Anesthesia Transfer of Care Note  Patient: Tammy [name]  Procedure(s) Performed: Procedure(s) (LRB): ESOPHAGOGASTRODUODENOSCOPY (EGD) WITH PROPOFOL (N/A)  Patient Location: endoscopy  Anesthesia Type: MAC  Level of Consciousness: sedated, patient cooperative and responds to stimulation  Airway & Oxygen Therapy: Patient Spontanous Breathing and Patient connected to face mask oxgen  Post-op Assessment: Report given to endoscopy RN and Post -op Vital signs reviewed and stable  Post vital signs: Reviewed and stable  Complications: No apparent anesthesia complications

## 2014-01-29 NOTE — Anesthesia Postprocedure Evaluation (Signed)
  Anesthesia Post-op Note  Patient: [name]  Procedure(s) Performed: Procedure(s) (LRB): ESOPHAGOGASTRODUODENOSCOPY (EGD) WITH PROPOFOL (N/A)  Patient Location: PACU  Anesthesia Type: MAC  Level of Consciousness: awake and alert   Airway and Oxygen Therapy: Patient Spontanous Breathing  Post-op Pain: mild  Post-op Assessment: Post-op Vital signs reviewed, Patient's Cardiovascular Status Stable, Respiratory Function Stable, Patent Airway and No signs of Nausea or vomiting  Last Vitals:  Filed Vitals:   01/29/14 1135  BP: 155/68  Temp:   Resp: 20    Post-op Vital Signs: stable   Complications: No apparent anesthesia complications

## 2014-01-29 NOTE — H&P (View-Only) (Signed)
Reviewed and agree with management. Robert D. Kaplan, M.D., FACG  

## 2014-01-29 NOTE — Interval H&P Note (Signed)
History and Physical Interval Note:  01/29/2014 11:01 AM  Tammy [name]  has presented today for surgery, with the diagnosis of Black tarry stools [K92.1 (ICD-10-CM)]  The various methods of treatment have been discussed with the patient and family. After consideration of risks, benefits and other options for treatment, the patient has consented to  Procedure(s): ESOPHAGOGASTRODUODENOSCOPY (EGD) WITH PROPOFOL (N/A) as a surgical intervention .  The patient's history has been reviewed, patient examined, no change in status, stable for surgery.  I have reviewed the patient's chart and labs.  Questions were answered to the patient's satisfaction.    The recent H&P (dated *01/17/14**) was reviewed, the patient was examined and there is no change in the patients condition since that H&P was completed.   Tammy [name]  01/29/2014, 11:01 AM    Tammy [name]

## 2014-01-30 ENCOUNTER — Encounter (HOSPITAL_COMMUNITY): Payer: Self-pay | Admitting: Gastroenterology

## 2014-02-01 ENCOUNTER — Ambulatory Visit (AMBULATORY_SURGERY_CENTER): Payer: Self-pay

## 2014-02-01 VITALS — Ht 64.0 in | Wt 264.8 lb

## 2014-02-01 DIAGNOSIS — K625 Hemorrhage of anus and rectum: Secondary | ICD-10-CM

## 2014-02-01 NOTE — Progress Notes (Signed)
Per pt, no allergies to soy or egg products.Pt not taking any weight loss meds or using  O2 at home. 

## 2014-02-05 ENCOUNTER — Other Ambulatory Visit: Payer: Self-pay | Admitting: *Deleted

## 2014-02-06 ENCOUNTER — Ambulatory Visit (AMBULATORY_SURGERY_CENTER): Payer: BC Managed Care – PPO | Admitting: Gastroenterology

## 2014-02-06 ENCOUNTER — Encounter: Payer: Self-pay | Admitting: Gastroenterology

## 2014-02-06 ENCOUNTER — Other Ambulatory Visit (INDEPENDENT_AMBULATORY_CARE_PROVIDER_SITE_OTHER): Payer: BC Managed Care – PPO

## 2014-02-06 ENCOUNTER — Other Ambulatory Visit: Payer: Self-pay

## 2014-02-06 VITALS — BP 150/76 | HR 74 | Temp 97.0°F | Resp 17 | Ht 64.0 in | Wt 264.0 lb

## 2014-02-06 DIAGNOSIS — K625 Hemorrhage of anus and rectum: Secondary | ICD-10-CM

## 2014-02-06 DIAGNOSIS — R195 Other fecal abnormalities: Secondary | ICD-10-CM

## 2014-02-06 DIAGNOSIS — K648 Other hemorrhoids: Secondary | ICD-10-CM

## 2014-02-06 LAB — CBC WITH DIFFERENTIAL/PLATELET
Basophils Absolute: 0 10*3/uL (ref 0.0–0.1)
Basophils Relative: 0.5 % (ref 0.0–3.0)
EOS ABS: 0.5 10*3/uL (ref 0.0–0.7)
EOS PCT: 7.9 % — AB (ref 0.0–5.0)
HCT: 41.8 % (ref 36.0–46.0)
HEMOGLOBIN: 13.8 g/dL (ref 12.0–15.0)
LYMPHS PCT: 34.6 % (ref 12.0–46.0)
Lymphs Abs: 2 10*3/uL (ref 0.7–4.0)
MCHC: 33 g/dL (ref 30.0–36.0)
MCV: 93.1 fl (ref 78.0–100.0)
Monocytes Absolute: 0.7 10*3/uL (ref 0.1–1.0)
Monocytes Relative: 11.1 % (ref 3.0–12.0)
NEUTROS ABS: 2.7 10*3/uL (ref 1.4–7.7)
Neutrophils Relative %: 45.9 % (ref 43.0–77.0)
Platelets: 194 10*3/uL (ref 150.0–400.0)
RBC: 4.5 Mil/uL (ref 3.87–5.11)
RDW: 13.7 % (ref 11.5–15.5)
WBC: 5.9 10*3/uL (ref 4.0–10.5)

## 2014-02-06 MED ORDER — SODIUM CHLORIDE 0.9 % IV SOLN: 500.0000 mL | INTRAVENOUS | Status: DC

## 2014-02-06 NOTE — Patient Instructions (Signed)
Discharge instructions given with verbal understanding. Handouts on hemorrhoids and a high fiber diet. Resume previous medications. hemeoccults cards given in recovery room. Wait 2 weeks to start February 20, 2014 CBC today. Avoids NSAIDS. YOU HAD AN ENDOSCOPIC PROCEDURE TODAY AT THE [facility]: Refer to the procedure report that was given to you for any specific questions about what was found during the examination.  If the procedure report does not answer your questions, please call your gastroenterologist to clarify.  If you requested that your care partner not be given the details of your procedure findings, then the procedure report has been included in a sealed envelope for you to review at your convenience later.  YOU SHOULD EXPECT: Some feelings of bloating in the abdomen. Passage of more gas than usual.  Walking can help get rid of the air that was put into your GI tract during the procedure and reduce the bloating. If you had a lower endoscopy (such as a colonoscopy or flexible sigmoidoscopy) you may notice spotting of blood in your stool or on the toilet paper. If you underwent a bowel prep for your procedure, then you may not have a normal bowel movement for a few days.  DIET: Your first meal following the procedure should be a light meal and then it is ok to progress to your normal diet.  A half-sandwich or bowl of soup is an example of a good first meal.  Heavy or fried foods are harder to digest and may make you feel nauseous or bloated.  Likewise meals heavy in dairy and vegetables can cause extra gas to form and this can also increase the bloating.  Drink plenty of fluids but you should avoid alcoholic beverages for 24 hours.  ACTIVITY: Your care partner should take you home directly after the procedure.  You should plan to take it easy, moving slowly for the rest of the day.  You can resume normal activity the day after the procedure however you should NOT DRIVE or use heavy  machinery for 24 hours (because of the sedation medicines used during the test).    SYMPTOMS TO REPORT IMMEDIATELY: A gastroenterologist can be reached at any hour.  During normal business hours, 8:30 AM to 5:00 PM Monday through Friday, call [phone].  After hours and on weekends, please call the GI answering service at [phone] who will take a message and have the physician on call contact you.   Following lower endoscopy (colonoscopy or flexible sigmoidoscopy):  Excessive amounts of blood in the stool  Significant tenderness or worsening of abdominal pains  Swelling of the abdomen that is new, acute  Fever of 100F or higher  FOLLOW UP: If any biopsies were taken you will be contacted by phone or by letter within the next 1-3 weeks.  Call your gastroenterologist if you have not heard about the biopsies in 3 weeks.  Our staff will call the home number listed on your records the next business day following your procedure to check on you and address any questions or concerns that you may have at that time regarding the information given to you following your procedure. This is a courtesy call and so if there is no answer at the home number and we have not heard from you through the emergency physician on call, we will assume that you have returned to your regular daily activities without incident.  SIGNATURES/CONFIDENTIALITY: You and/or your care partner have signed paperwork which will be entered into  your electronic medical record.  These signatures attest to the fact that that the information above on your After Visit Summary has been reviewed and is understood.  Full responsibility of the confidentiality of this discharge information lies with you and/or your care-partner.

## 2014-02-06 NOTE — Progress Notes (Signed)
Report to PACU, RN, vss, BBS= Clear.  

## 2014-02-06 NOTE — Op Note (Addendum)
[facility] [address] [place], [id number]   COLONOSCOPY PROCEDURE REPORT  PATIENT: Tammy [name], Tammy [name]  MR#: [id number] BIRTHDATE: October 21, 1955 , 58  yrs. old GENDER: female ENDOSCOPIST: Louis Meckelobert D Austin Pongratz, MD REFERRED BY: PROCEDURE DATE:  02/06/2014 PROCEDURE:   Colonoscopy, diagnostic First Screening Colonoscopy - Avg.  risk and is 50 yrs.  old or older - No.  Prior Negative Screening - Now for repeat screening. Other: See Comments  History of Adenoma - Now for follow-up colonoscopy & has been > or = to 3 yrs.  N/A  Polyps Removed Today? No.  Recommend repeat exam, <10 yrs? No. ASA CLASS:   Class II INDICATIONS:heme-positive stool. MEDICATIONS: Monitored anesthesia care and Propofol 280 mg IV  DESCRIPTION OF PROCEDURE:   After the risks benefits and alternatives of the procedure were thoroughly explained, informed consent was obtained.  The digital rectal exam revealed multiple large skin tags.   The LB [id number]  endoscope was introduced through the anus and advanced to the cecum, which was identified by both the appendix and ileocecal valve. No adverse events experienced.   The quality of the prep was Suprep good  The instrument was then slowly withdrawn as the colon was fully examined.      COLON FINDINGS: Internal hemorrhoids were found.   The examination was otherwise normal.  Retroflexed views revealed no abnormalities. The time to cecum=10 minutes 21 seconds.  Withdrawal time=6 minutes 09 seconds.  The scope was withdrawn and the procedure completed. COMPLICATIONS: There were no immediate complications.  ENDOSCOPIC IMPRESSION: 1.   Internal hemorrhoids 2.   The examination was otherwise normal  No source for GI bleeding determined by upper endoscopy or colonoscopy.  Bleeding questionably related to NSAID use  RECOMMENDATIONS: Followup hemeoccults CBC Avoid NSAIdS  eSigned:  Louis Meckelobert D Shateria Paternostro, MD 02/06/2014 2:51 PM Revised: 02/06/2014  2:51 PM  cc: Shaune Pollackonna Gates, MD

## 2014-02-07 ENCOUNTER — Telehealth: Payer: Self-pay | Admitting: *Deleted

## 2014-02-07 NOTE — Telephone Encounter (Signed)
No answer. Left message to call if questions or concerns. 

## 2014-02-28 ENCOUNTER — Other Ambulatory Visit: Payer: BC Managed Care – PPO

## 2014-02-28 DIAGNOSIS — R195 Other fecal abnormalities: Secondary | ICD-10-CM

## 2014-02-28 LAB — HEMOCCULT SLIDES (X 3 CARDS)
FECAL OCCULT BLD: NEGATIVE
OCCULT 1: NEGATIVE
OCCULT 2: NEGATIVE
OCCULT 3: NEGATIVE
OCCULT 4: NEGATIVE
OCCULT 5: NEGATIVE

## 2014-03-01 ENCOUNTER — Other Ambulatory Visit: Payer: Self-pay

## 2014-03-01 DIAGNOSIS — Z1231 Encounter for screening mammogram for malignant neoplasm of breast: Secondary | ICD-10-CM

## 2014-03-01 NOTE — Progress Notes (Signed)
Quick Note:  Please inform the patient that Hemoccults are negative. No further GI workup ______

## 2014-03-14 ENCOUNTER — Ambulatory Visit
Admission: RE | Admit: 2014-03-14 | Discharge: 2014-03-14 | Disposition: A | Discharge status: Home / Self Care | Payer: BC Managed Care – PPO | Source: Ambulatory Visit

## 2014-03-14 DIAGNOSIS — Z1231 Encounter for screening mammogram for malignant neoplasm of breast: Secondary | ICD-10-CM

## 2014-10-04 ENCOUNTER — Encounter: Payer: Self-pay | Admitting: Gastroenterology

## 2017-09-10 ENCOUNTER — Ambulatory Visit: Payer: Self-pay | Admitting: Orthopedic Surgery

## 2017-09-29 ENCOUNTER — Encounter (HOSPITAL_COMMUNITY): Payer: Self-pay | Admitting: *Deleted

## 2017-09-29 NOTE — H&P (Signed)
TOTAL KNEE ADMISSION H&P  Patient is being admitted for right total knee arthroplasty.  Subjective:  Chief Complain right knee pain.  HPI: Tammy [name], 62 y.o. female, has a history of pain and functional disability in the right knee due to arthritis related to trauma and has failed non-surgical conservative treatments for greater than 12 weeks to includecorticosteriod injections, viscosupplementation injections, flexibility and strengthening excercises, supervised PT with diminished ADL's post treatment, weight reduction as appropriate and activity modification.  Onset of symptoms was gradual, starting 3 years ago with gradually worsening course since that time. The patient noted prior procedures on the knee to include  arthroscopy on the right knee(s).  Patient currently rates pain in the right knee(s) at 7 out of 10 with activity. Patient has worsening of pain with activity and weight bearing, pain that interferes with activities of daily living and joint swelling.  Patient has evidence of subchondral sclerosis, periarticular osteophytes and joint space narrowing by imaging studies. There is no active infection.  Patient Active Problem List   Diagnosis Date Noted  . Black stool 01/16/2014  . Rectal bleeding 01/16/2014  . Esophageal reflux 09/16/2010  . Special screening for malignant neoplasms, colon 09/16/2010  . DIABETES MELLITUS 09/24/2007  . ALLERGIC RHINITIS 09/24/2007  . DYSFUNCTIONAL UTERINE BLEEDING 09/24/2007  . PLANTAR FASCIITIS 09/24/2007  . OVARIAN CYST, RIGHT 07/09/2006  . SCHATZKI'S RING, HX OF 08/27/1998  . HIATAL HERNIA 07/19/1998   Past Medical History:  Diagnosis Date  . Allergic rhinitis, cause unspecified   . Diaphragmatic hernia without mention of obstruction or gangrene   . GERD (gastroesophageal reflux disease)   . Other and unspecified ovarian cyst   . Other disorder of menstruation and other abnormal bleeding from female genital tract   . Plantar  fascial fibromatosis   . Schatzki's ring   . Stricture and stenosis of esophagus     Past Surgical History:  Procedure Laterality Date  . BACK SURGERY     lumbar  . ESOPHAGOGASTRODUODENOSCOPY (EGD) WITH PROPOFOL N/A 01/29/2014   Procedure: ESOPHAGOGASTRODUODENOSCOPY (EGD) WITH PROPOFOL;  Surgeon: Louis Meckel, MD;  Location: [facility];  Service: Endoscopy;  Laterality: N/A;  . KNEE SURGERY Right 02/2012   trorn meniscus repair  . TONSILLECTOMY      No current facility-administered medications for this encounter.    Current Outpatient Medications  Medication Sig Dispense Refill Last Dose  . acetaminophen (TYLENOL) 500 MG tablet Take 500 mg by mouth See admin instructions. Four days out of the week around 1 pm   Unknown  . Aspirin-Salicylamide-Caffeine (BC HEADACHE POWDER PO) Take 1 each by mouth once as needed (headache.). As needed   01/15/14  . celecoxib (CELEBREX) 200 MG capsule Take 200 mg by mouth every morning.    02/05/2014  . Glucosamine-Chondroitin (COSAMIN DS PO) Take 1 tablet by mouth every morning.    02/05/2014  . hydrocortisone (ANUSOL-HC) 25 MG suppository Place 1 suppository (25 mg total) rectally at bedtime. 24 suppository 1 02/05/2014  . ibuprofen (ADVIL,MOTRIN) 200 MG tablet Take 400-600 mg by mouth. 4 days out of the week around 1 or 2 pm.   Unknown  . loratadine (CLARITIN) 10 MG tablet Take 10 mg by mouth every morning.    02/06/2014  . Multiple Vitamins-Minerals (MULTIVITAMIN PO) Take 1 tablet by mouth every morning. Women's Centrum Silver   02/05/2014  . omeprazole-sodium bicarbonate (ZEGERID) 40-1100 MG per capsule Take 1 capsule by mouth daily before breakfast.   02/06/2014   Allergies  Allergen Reactions  . Propoxyphene Hcl     hallucinations  . Amoxicillin Rash  . Penicillins Rash    Social History   Tobacco Use  . Smoking status: Never Smoker  . Smokeless tobacco: Never Used  Substance Use Topics  . Alcohol use: No    Family History  Problem  Relation Age of Onset  . Hypertension Mother   . Colon cancer Neg Hx      Review of Systems  Constitutional: Negative.   HENT: Negative.   Eyes: Negative.   Respiratory: Negative.   Cardiovascular: Negative.   Gastrointestinal: Positive for heartburn.  Genitourinary: Negative.   Musculoskeletal: Positive for joint pain.  Skin: Negative.   Neurological: Negative.   Endo/Heme/Allergies: Negative.   Psychiatric/Behavioral: Negative.     Objective:  Physical Exam  Constitutional: She is oriented to person, place, and time. She appears well-developed.  HENT:  Head: Normocephalic.  Eyes: EOM are normal.  Neck: Normal range of motion.  Cardiovascular: Normal rate and intact distal pulses.  Respiratory: Effort normal and breath sounds normal.  GI: Soft.  Genitourinary:  Genitourinary Comments: Deferred  Musculoskeletal:  Knee pain with ROM. Knee is stable. Calf soft and non tender.  Neurological: She is alert and oriented to person, place, and time.  Skin: Skin is warm and dry.  Psychiatric: Her behavior is normal.    Vital signs in last 24 hours: BP: ()/()  Arterial Line BP: ()/()   Labs:   Estimated body mass index is 45.32 kg/m as calculated from the following:   Height as of 02/06/14: 5\' 4"  (1.626 m).   Weight as of 02/06/14: 119.7 kg (264 lb).   Imaging Review Plain radiographs demonstrate moderate degenerative joint disease of the right knee(s). The overall alignment ismild varus. The bone quality appears to be good for age and reported activity level.   Preoperative templating of the joint replacement has been completed, documented, and submitted to the Operating Room personnel in order to optimize intra-operative equipment management.   Anticipated LOS equal to or greater than 2 midnights due to - Age 63 and older with one or more of the following:  - Obesity  - Expected need for hospital services (PT, OT, Nursing) required for safe  discharge  -  Anticipated need for postoperative skilled nursing care or inpatient rehab  - Active co-morbidities: None OR   - Unanticipated findings during/Post Surgery: None  - Patient is a high risk of re-admission due to: None     Assessment/Plan:  End stage arthritis, right knee   The patient history, physical examination, clinical judgment of the provider and imaging studies are consistent with end stage degenerative joint disease of the right knee(s) and total knee arthroplasty is deemed medically necessary. The treatment options including medical management, injection therapy arthroscopy and arthroplasty were discussed at length. The risks and benefits of total knee arthroplasty were presented and reviewed. The risks due to aseptic loosening, infection, stiffness, patella tracking problems, thromboembolic complications and other imponderables were discussed. The patient acknowledged the explanation, agreed to proceed with the plan and consent was signed. Patient is being admitted for inpatient treatment for surgery, pain control, PT, OT, prophylactic antibiotics, VTE prophylaxis, progressive ambulation and ADL's and discharge planning. The patient is planning to be discharged to skilled nursing facility.  Will use IV tranexamic acid. Contraindications and adverse affects of Tranexamic acid discussed in detail. Patient denies any of these at this time and understands the risks and benefits.

## 2017-10-11 NOTE — Progress Notes (Signed)
09-27-17 Surgical Clearance from Dr. Kevan NyGates, CMP, CBC, HGA1C and EKG on chart.

## 2017-10-11 NOTE — Patient Instructions (Addendum)
Tammy [name]  10/11/2017   Your procedure is scheduled on: 10-22-17  Report to [facility] Main  Entrance    Report to Admitting at 1:15 PM    Call this number if you have problems the morning of surgery [phone]   Remember: Do not eat food or drink liquids :After Midnight.You may have a Clear Liquid Diet from Midnight until 9:45 AM. After 9:45 AM nothing until after surgery.     CLEAR LIQUID DIET   Foods Allowed                                                                     Foods Excluded  Coffee and tea, regular and decaf                             liquids that you cannot  Plain Jell-O in any flavor                                             see through such as: Fruit ices (not with fruit pulp)                                     milk, soups, orange juice  Iced Popsicles                                    All solid food Carbonated beverages, regular and diet                                    Cranberry, grape and apple juices Sports drinks like Gatorade Lightly seasoned clear broth or consume(fat free) Sugar, honey syrup  Sample Menu Breakfast                                Lunch                                     Supper Cranberry juice                    Beef broth                            Chicken broth Jell-O                                     Grape juice  Apple juice Coffee or tea                        Jell-O                                      Popsicle                                                Coffee or tea                        Coffee or tea  _____________________________________________________________________     Take these medicines the morning of surgery with A SIP OF WATER: Omeprazole (Prilosec)- Per pt's preference                                You may not have any metal on your body including hair pins and              piercings  Do not wear jewelry, make-up, lotions, powders or  perfumes, deodorant             Do not wear nail polish.  Do not shave  48 hours prior to surgery.               Do not bring valuables to the hospital. [facility] IS NOT             RESPONSIBLE   FOR VALUABLES.  Contacts, dentures or bridgework may not be worn into surgery.  Leave suitcase in the car. After surgery it may be brought to your room.      Special Instructions: N/A              Please read over the following fact sheets you were given: _____________________________________________________________________          [facility] - Pre[facility] Before surgery, you can play an important role.  Because skin is not sterile, your skin needs to be as free of germs as possible.  You can reduce the number of germs on your skin by washing with CHG (chlorahexidine gluconate) soap before surgery.  CHG is an antiseptic cleaner which kills germs and bonds with the skin to continue killing germs even after washing. Please DO NOT use if you have an allergy to CHG or antibacterial soaps.  If your skin becomes reddened/irritated stop using the CHG and inform your nurse when you arrive at Short Stay. Do not shave (including legs and underarms) for at least 48 hours prior to the first CHG shower.  You may shave your face/neck. Please follow these instructions carefully:  1.  Shower with CHG Soap the night before surgery and the  morning of Surgery.  2.  If you choose to wash your hair, wash your hair first as usual with your  normal  shampoo.  3.  After you shampoo, rinse your hair and body thoroughly to remove the  shampoo.                           4.  Use CHG as you would any other liquid soap.  You can apply chg directly  to the skin and wash                       Gently with a scrungie or clean washcloth.  5.  Apply the CHG Soap to your body ONLY FROM THE NECK DOWN.   Do not use on face/ open                           Wound or open sores. Avoid contact with eyes, ears mouth and genitals  (private parts).                       Wash face,  Genitals (private parts) with your normal soap.             6.  Wash thoroughly, paying special attention to the area where your surgery  will be performed.  7.  Thoroughly rinse your body with warm water from the neck down.  8.  DO NOT shower/wash with your normal soap after using and rinsing off  the CHG Soap.                9.  Pat yourself dry with a clean towel.            10.  Wear clean pajamas.            11.  Place clean sheets on your bed the night of your first shower and do not  sleep with pets. Day of Surgery : Do not apply any lotions/deodorants the morning of surgery.  Please wear clean clothes to the hospital/surgery center.  FAILURE TO FOLLOW THESE INSTRUCTIONS MAY RESULT IN THE CANCELLATION OF YOUR SURGERY PATIENT SIGNATURE_________________________________  NURSE SIGNATURE__________________________________  ________________________________________________________________________   Tammy [name]  An incentive spirometer is a tool that can help keep your lungs clear and active. This tool measures how well you are filling your lungs with each breath. Taking long deep breaths may help reverse or decrease the chance of developing breathing (pulmonary) problems (especially infection) following:  A long period of time when you are unable to move or be active. BEFORE THE PROCEDURE   If the spirometer includes an indicator to show your best effort, your nurse or respiratory therapist will set it to a desired goal.  If possible, sit up straight or lean slightly forward. Try not to slouch.  Hold the incentive spirometer in an upright position. INSTRUCTIONS FOR USE  1. Sit on the edge of your bed if possible, or sit up as far as you can in bed or on a chair. 2. Hold the incentive spirometer in an upright position. 3. Breathe out normally. 4. Place the mouthpiece in your mouth and seal your lips tightly around  it. 5. Breathe in slowly and as deeply as possible, raising the piston or the ball toward the top of the column. 6. Hold your breath for 3-5 seconds or for as long as possible. Allow the piston or ball to fall to the bottom of the column. 7. Remove the mouthpiece from your mouth and breathe out normally. 8. Rest for a few seconds and repeat Steps 1 through 7 at least 10 times every 1-2 hours when you are awake. Take your time and take a few normal breaths between deep breaths. 9. The spirometer may include an indicator to show your best effort. Use the indicator as a goal to work  toward during each repetition. 10. After each set of 10 deep breaths, practice coughing to be sure your lungs are clear. If you have an incision (the cut made at the time of surgery), support your incision when coughing by placing a pillow or rolled up towels firmly against it. Once you are able to get out of bed, walk around indoors and cough well. You may stop using the incentive spirometer when instructed by your caregiver.  RISKS AND COMPLICATIONS  Take your time so you do not get dizzy or light-headed.  If you are in pain, you may need to take or ask for pain medication before doing incentive spirometry. It is harder to take a deep breath if you are having pain. AFTER USE  Rest and breathe slowly and easily.  It can be helpful to keep track of a log of your progress. Your caregiver can provide you with a simple table to help with this. If you are using the spirometer at home, follow these instructions: SEEK MEDICAL CARE IF:   You are having difficultly using the spirometer.  You have trouble using the spirometer as often as instructed.  Your pain medication is not giving enough relief while using the spirometer.  You develop fever of 100.5 F (38.1 C) or higher. SEEK IMMEDIATE MEDICAL CARE IF:   You cough up bloody sputum that had not been present before.  You develop fever of 102 F (38.9 C) or  greater.  You develop worsening pain at or near the incision site. MAKE SURE YOU:   Understand these instructions.  Will watch your condition.  Will get help right away if you are not doing well or get worse. Document Released: 08/17/2006 Document Revised: 06/29/2011 Document Reviewed: 10/18/2006 Exit[employer] Patient Information 2014 [employer], [place].   ________________________________________________________________________  WHAT IS A BLOOD TRANSFUSION? Blood Transfusion Information  A transfusion is the replacement of blood or some of its parts. Blood is made up of multiple cells which provide different functions.  Red blood cells carry oxygen and are used for blood loss replacement.  White blood cells fight against infection.  Platelets control bleeding.  Plasma helps clot blood.  Other blood products are available for specialized needs, such as hemophilia or other clotting disorders. BEFORE THE TRANSFUSION  Who gives blood for transfusions?   Healthy volunteers who are fully evaluated to make sure their blood is safe. This is blood bank blood. Transfusion therapy is the safest it has ever been in the practice of medicine. Before blood is taken from a donor, a complete history is taken to make sure that person has no history of diseases nor engages in risky social behavior (examples are intravenous drug use or sexual activity with multiple partners). The donor's travel history is screened to minimize risk of transmitting infections, such as malaria. The donated blood is tested for signs of infectious diseases, such as HIV and hepatitis. The blood is then tested to be sure it is compatible with you in order to minimize the chance of a transfusion reaction. If you or a relative donates blood, this is often done in anticipation of surgery and is not appropriate for emergency situations. It takes many days to process the donated blood. RISKS AND COMPLICATIONS Although transfusion therapy  is very safe and saves many lives, the main dangers of transfusion include:   Getting an infectious disease.  Developing a transfusion reaction. This is an allergic reaction to something in the blood you were given. Every precaution is taken  to prevent this. The decision to have a blood transfusion has been considered carefully by your caregiver before blood is given. Blood is not given unless the benefits outweigh the risks. AFTER THE TRANSFUSION  Right after receiving a blood transfusion, you will usually feel much better and more energetic. This is especially true if your red blood cells have gotten low (anemic). The transfusion raises the level of the red blood cells which carry oxygen, and this usually causes an energy increase.  The nurse administering the transfusion will monitor you carefully for complications. HOME CARE INSTRUCTIONS  No special instructions are needed after a transfusion. You may find your energy is better. Speak with your caregiver about any limitations on activity for underlying diseases you may have. SEEK MEDICAL CARE IF:   Your condition is not improving after your transfusion.  You develop redness or irritation at the intravenous (IV) site. SEEK IMMEDIATE MEDICAL CARE IF:  Any of the following symptoms occur over the next 12 hours:  Shaking chills.  You have a temperature by mouth above 102 F (38.9 C), not controlled by medicine.  Chest, back, or muscle pain.  People around you feel you are not acting correctly or are confused.  Shortness of breath or difficulty breathing.  Dizziness and fainting.  You get a rash or develop hives.  You have a decrease in urine output.  Your urine turns a dark color or changes to pink, red, or brown. Any of the following symptoms occur over the next 10 days:  You have a temperature by mouth above 102 F (38.9 C), not controlled by medicine.  Shortness of breath.  Weakness after normal activity.  The white  part of the eye turns yellow (jaundice).  You have a decrease in the amount of urine or are urinating less often.  Your urine turns a dark color or changes to pink, red, or brown. Document Released: 04/03/2000 Document Revised: 06/29/2011 Document Reviewed: 11/21/2007 [facility] Patient Information 2014 [place], [place].  _______________________________________________________________________

## 2017-10-13 ENCOUNTER — Encounter (HOSPITAL_COMMUNITY)
Admission: RE | Admit: 2017-10-13 | Discharge: 2017-10-13 | Disposition: A | Discharge status: Home / Self Care | Payer: No Typology Code available for payment source | Source: Ambulatory Visit | Attending: Specialist | Admitting: Specialist

## 2017-10-13 ENCOUNTER — Encounter (HOSPITAL_COMMUNITY): Payer: Self-pay | Admitting: *Deleted

## 2017-10-13 ENCOUNTER — Other Ambulatory Visit: Payer: Self-pay

## 2017-10-13 DIAGNOSIS — M1711 Unilateral primary osteoarthritis, right knee: Secondary | ICD-10-CM | POA: Diagnosis not present

## 2017-10-13 DIAGNOSIS — Z01812 Encounter for preprocedural laboratory examination: Secondary | ICD-10-CM | POA: Insufficient documentation

## 2017-10-13 LAB — URINALYSIS, ROUTINE W REFLEX MICROSCOPIC
BILIRUBIN URINE: NEGATIVE
GLUCOSE, UA: NEGATIVE mg/dL
HGB URINE DIPSTICK: NEGATIVE
Ketones, ur: NEGATIVE mg/dL
Leukocytes, UA: NEGATIVE
Nitrite: NEGATIVE
PROTEIN: NEGATIVE mg/dL
Specific Gravity, Urine: 1.02 (ref 1.005–1.030)
pH: 7.5 (ref 5.0–8.0)

## 2017-10-13 LAB — PROTIME-INR
INR: 1.18
Prothrombin Time: 14.9 seconds (ref 11.4–15.2)

## 2017-10-13 LAB — SURGICAL PCR SCREEN
MRSA, PCR: NEGATIVE
Staphylococcus aureus: POSITIVE — AB

## 2017-10-13 LAB — APTT: aPTT: 32 seconds (ref 24–36)

## 2017-10-14 LAB — ABO/RH: ABO/RH(D): AB POS

## 2017-10-21 MED ORDER — TRANEXAMIC ACID 1000 MG/10ML IV SOLN
1000.0000 mg | INTRAVENOUS | Status: AC
  Administered 2017-10-22: 1000 mg via INTRAVENOUS
  Filled 2017-10-21: qty 1100

## 2017-10-21 MED ORDER — VANCOMYCIN HCL 10 G IV SOLR
1500.0000 mg | INTRAVENOUS | Status: AC
  Administered 2017-10-22: 1500 mg via INTRAVENOUS
  Filled 2017-10-21: qty 1500

## 2017-10-22 ENCOUNTER — Other Ambulatory Visit: Payer: Self-pay

## 2017-10-22 ENCOUNTER — Inpatient Hospital Stay (HOSPITAL_COMMUNITY): Payer: No Typology Code available for payment source | Admitting: Anesthesiology

## 2017-10-22 ENCOUNTER — Encounter (HOSPITAL_COMMUNITY)
Admission: RE | Disposition: A | Discharge status: Home / Self Care | Payer: Self-pay | Source: Ambulatory Visit | Attending: Specialist

## 2017-10-22 ENCOUNTER — Encounter (HOSPITAL_COMMUNITY): Payer: Self-pay | Admitting: Emergency Medicine

## 2017-10-22 ENCOUNTER — Inpatient Hospital Stay (HOSPITAL_COMMUNITY)
Admission: RE | Admit: 2017-10-22 | Discharge: 2017-10-25 | DRG: 470 | Disposition: A | Discharge status: Home / Self Care | Payer: No Typology Code available for payment source | Source: Ambulatory Visit | Attending: Specialist | Admitting: Specialist

## 2017-10-22 DIAGNOSIS — Z7982 Long term (current) use of aspirin: Secondary | ICD-10-CM

## 2017-10-22 DIAGNOSIS — E119 Type 2 diabetes mellitus without complications: Secondary | ICD-10-CM | POA: Diagnosis present

## 2017-10-22 DIAGNOSIS — K219 Gastro-esophageal reflux disease without esophagitis: Secondary | ICD-10-CM | POA: Diagnosis present

## 2017-10-22 DIAGNOSIS — Z79899 Other long term (current) drug therapy: Secondary | ICD-10-CM

## 2017-10-22 DIAGNOSIS — Z96659 Presence of unspecified artificial knee joint: Secondary | ICD-10-CM

## 2017-10-22 DIAGNOSIS — Z880 Allergy status to penicillin: Secondary | ICD-10-CM | POA: Diagnosis not present

## 2017-10-22 DIAGNOSIS — M1711 Unilateral primary osteoarthritis, right knee: Secondary | ICD-10-CM | POA: Diagnosis present

## 2017-10-22 DIAGNOSIS — Z6841 Body Mass Index (BMI) 40.0 and over, adult: Secondary | ICD-10-CM

## 2017-10-22 DIAGNOSIS — E669 Obesity, unspecified: Secondary | ICD-10-CM | POA: Diagnosis present

## 2017-10-22 DIAGNOSIS — Z888 Allergy status to other drugs, medicaments and biological substances status: Secondary | ICD-10-CM

## 2017-10-22 HISTORY — PX: TOTAL KNEE ARTHROPLASTY: SHX125

## 2017-10-22 LAB — TYPE AND SCREEN
ABO/RH(D): AB POS
ANTIBODY SCREEN: NEGATIVE

## 2017-10-22 SURGERY — ARTHROPLASTY, KNEE, TOTAL
Anesthesia: Monitor Anesthesia Care | Site: Knee | Laterality: Right

## 2017-10-22 MED ORDER — ONDANSETRON HCL 4 MG PO TABS: 4.0000 mg | ORAL_TABLET | Freq: Four times a day (QID) | ORAL | Status: DC | PRN

## 2017-10-22 MED ORDER — METOCLOPRAMIDE HCL 5 MG PO TABS: 5.0000 mg | ORAL_TABLET | Freq: Three times a day (TID) | ORAL | Status: DC | PRN

## 2017-10-22 MED ORDER — PROPOFOL 500 MG/50ML IV EMUL
INTRAVENOUS | Status: DC | PRN
  Administered 2017-10-22: 50 ug/kg/min via INTRAVENOUS

## 2017-10-22 MED ORDER — MIDAZOLAM HCL 2 MG/2ML IJ SOLN
2.0000 mg | Freq: Once | INTRAMUSCULAR | Status: AC
  Administered 2017-10-22: 1 mg via INTRAVENOUS
  Filled 2017-10-22: qty 2

## 2017-10-22 MED ORDER — DEXAMETHASONE SODIUM PHOSPHATE 10 MG/ML IJ SOLN
10.0000 mg | Freq: Once | INTRAMUSCULAR | Status: AC
  Administered 2017-10-22: 10 mg via INTRAVENOUS

## 2017-10-22 MED ORDER — PHENOL 1.4 % MT LIQD
1.0000 | OROMUCOSAL | Status: DC | PRN
  Filled 2017-10-22: qty 177

## 2017-10-22 MED ORDER — BUPIVACAINE-EPINEPHRINE 0.25% -1:200000 IJ SOLN
INTRAMUSCULAR | Status: DC | PRN
  Administered 2017-10-22: 30 mL

## 2017-10-22 MED ORDER — PROPOFOL 10 MG/ML IV BOLUS
INTRAVENOUS | Status: DC | PRN
  Administered 2017-10-22 (×3): 10 mg via INTRAVENOUS
  Administered 2017-10-22: 20 mg via INTRAVENOUS
  Administered 2017-10-22 (×2): 10 mg via INTRAVENOUS

## 2017-10-22 MED ORDER — SODIUM CHLORIDE 0.9 % IJ SOLN
INTRAMUSCULAR | Status: AC
  Filled 2017-10-22: qty 50

## 2017-10-22 MED ORDER — HYDROMORPHONE HCL 1 MG/ML IJ SOLN
INTRAMUSCULAR | Status: AC
  Filled 2017-10-22: qty 1

## 2017-10-22 MED ORDER — LORATADINE 10 MG PO TABS
10.0000 mg | ORAL_TABLET | Freq: Every morning | ORAL | Status: DC
  Administered 2017-10-23 – 2017-10-25 (×3): 10 mg via ORAL
  Filled 2017-10-22 (×3): qty 1

## 2017-10-22 MED ORDER — HYDROMORPHONE HCL 1 MG/ML IJ SOLN
0.2500 mg | INTRAMUSCULAR | Status: DC | PRN
  Administered 2017-10-22 (×2): 0.5 mg via INTRAVENOUS

## 2017-10-22 MED ORDER — DEXAMETHASONE SODIUM PHOSPHATE 10 MG/ML IJ SOLN: INTRAMUSCULAR | Status: DC | PRN

## 2017-10-22 MED ORDER — POLYETHYLENE GLYCOL 3350 17 G PO PACK: 17.0000 g | PACK | Freq: Every day | ORAL | Status: DC | PRN

## 2017-10-22 MED ORDER — PROPOFOL 10 MG/ML IV BOLUS
INTRAVENOUS | Status: AC
  Filled 2017-10-22: qty 20

## 2017-10-22 MED ORDER — BUPIVACAINE-EPINEPHRINE (PF) 0.25% -1:200000 IJ SOLN
INTRAMUSCULAR | Status: AC
  Filled 2017-10-22: qty 30

## 2017-10-22 MED ORDER — ONDANSETRON HCL 4 MG/2ML IJ SOLN: 4.0000 mg | Freq: Four times a day (QID) | INTRAMUSCULAR | Status: DC | PRN

## 2017-10-22 MED ORDER — FENTANYL CITRATE (PF) 100 MCG/2ML IJ SOLN
100.0000 ug | Freq: Once | INTRAMUSCULAR | Status: AC
  Administered 2017-10-22: 50 ug via INTRAVENOUS
  Filled 2017-10-22: qty 2

## 2017-10-22 MED ORDER — HYDROMORPHONE HCL 1 MG/ML IJ SOLN
0.5000 mg | INTRAMUSCULAR | Status: DC | PRN
Start: 2017-10-22 — End: 2017-10-25
  Administered 2017-10-24: 1 mg via INTRAVENOUS
  Filled 2017-10-22: qty 1

## 2017-10-22 MED ORDER — 0.9 % SODIUM CHLORIDE (POUR BTL) OPTIME
TOPICAL | Status: DC | PRN
Start: 2017-10-22 — End: 2017-10-22
  Administered 2017-10-22: 1000 mL

## 2017-10-22 MED ORDER — BUPIVACAINE IN DEXTROSE 0.75-8.25 % IT SOLN
INTRATHECAL | Status: DC | PRN
  Administered 2017-10-22: 1.8 mL via INTRATHECAL

## 2017-10-22 MED ORDER — DOCUSATE SODIUM 100 MG PO CAPS
100.0000 mg | ORAL_CAPSULE | Freq: Two times a day (BID) | ORAL | Status: DC
  Administered 2017-10-22 – 2017-10-25 (×5): 100 mg via ORAL
  Filled 2017-10-22 (×5): qty 1

## 2017-10-22 MED ORDER — VANCOMYCIN HCL IN DEXTROSE 1-5 GM/200ML-% IV SOLN
1000.0000 mg | Freq: Two times a day (BID) | INTRAVENOUS | Status: AC
  Administered 2017-10-23: 1000 mg via INTRAVENOUS
  Filled 2017-10-22: qty 200

## 2017-10-22 MED ORDER — PANTOPRAZOLE SODIUM 40 MG PO TBEC
40.0000 mg | DELAYED_RELEASE_TABLET | Freq: Every day | ORAL | Status: DC
  Administered 2017-10-23 – 2017-10-25 (×3): 40 mg via ORAL
  Filled 2017-10-22 (×3): qty 1

## 2017-10-22 MED ORDER — CLONIDINE HCL (ANALGESIA) 100 MCG/ML EP SOLN
EPIDURAL | Status: DC | PRN
  Administered 2017-10-22: 50 ug

## 2017-10-22 MED ORDER — METHOCARBAMOL 1000 MG/10ML IJ SOLN
500.0000 mg | Freq: Four times a day (QID) | INTRAVENOUS | Status: DC | PRN
  Administered 2017-10-22: 500 mg via INTRAVENOUS
  Filled 2017-10-22: qty 550
  Filled 2017-10-22: qty 5

## 2017-10-22 MED ORDER — ACETAMINOPHEN 500 MG PO TABS
1000.0000 mg | ORAL_TABLET | Freq: Four times a day (QID) | ORAL | Status: AC
  Administered 2017-10-22 – 2017-10-23 (×4): 1000 mg via ORAL
  Filled 2017-10-22 (×4): qty 2

## 2017-10-22 MED ORDER — CHLORHEXIDINE GLUCONATE 4 % EX LIQD: 60.0000 mL | Freq: Once | CUTANEOUS | Status: DC

## 2017-10-22 MED ORDER — SODIUM CHLORIDE 0.9 % IR SOLN
Status: DC | PRN
  Administered 2017-10-22: 1000 mL

## 2017-10-22 MED ORDER — OXYCODONE HCL 5 MG PO TABS
5.0000 mg | ORAL_TABLET | ORAL | Status: DC | PRN
  Administered 2017-10-22: 5 mg via ORAL
  Administered 2017-10-23 – 2017-10-24 (×4): 10 mg via ORAL
  Filled 2017-10-22: qty 2
  Filled 2017-10-22: qty 1
  Filled 2017-10-22: qty 2
  Filled 2017-10-22: qty 1
  Filled 2017-10-22 (×2): qty 2
  Filled 2017-10-22: qty 1

## 2017-10-22 MED ORDER — FERROUS SULFATE 325 (65 FE) MG PO TABS
325.0000 mg | ORAL_TABLET | Freq: Three times a day (TID) | ORAL | Status: DC
  Administered 2017-10-23 – 2017-10-25 (×7): 325 mg via ORAL
  Filled 2017-10-22 (×7): qty 1

## 2017-10-22 MED ORDER — METHOCARBAMOL 500 MG PO TABS
500.0000 mg | ORAL_TABLET | Freq: Four times a day (QID) | ORAL | Status: DC | PRN
  Administered 2017-10-23 – 2017-10-25 (×7): 500 mg via ORAL
  Filled 2017-10-22 (×7): qty 1

## 2017-10-22 MED ORDER — SODIUM CHLORIDE 0.9 % IV SOLN
INTRAVENOUS | Status: DC
  Administered 2017-10-22: 21:00:00 via INTRAVENOUS

## 2017-10-22 MED ORDER — ALUM & MAG HYDROXIDE-SIMETH 200-200-20 MG/5ML PO SUSP: 30.0000 mL | ORAL | Status: DC | PRN

## 2017-10-22 MED ORDER — KETOROLAC TROMETHAMINE 30 MG/ML IJ SOLN
INTRAMUSCULAR | Status: AC
  Filled 2017-10-22: qty 1

## 2017-10-22 MED ORDER — OXYCODONE HCL 5 MG PO TABS
10.0000 mg | ORAL_TABLET | ORAL | Status: DC | PRN
  Administered 2017-10-23: 10 mg via ORAL
  Administered 2017-10-24: 15 mg via ORAL
  Administered 2017-10-24: 10 mg via ORAL
  Administered 2017-10-24: 15 mg via ORAL
  Administered 2017-10-24: 10 mg via ORAL
  Administered 2017-10-25 (×3): 15 mg via ORAL
  Filled 2017-10-22 (×3): qty 3
  Filled 2017-10-22 (×2): qty 2
  Filled 2017-10-22 (×2): qty 3

## 2017-10-22 MED ORDER — PROPOFOL 10 MG/ML IV BOLUS
INTRAVENOUS | Status: AC
  Filled 2017-10-22: qty 60

## 2017-10-22 MED ORDER — ONDANSETRON HCL 4 MG/2ML IJ SOLN
INTRAMUSCULAR | Status: AC
  Filled 2017-10-22: qty 2

## 2017-10-22 MED ORDER — MAGNESIUM CITRATE PO SOLN: 1.0000 | Freq: Once | ORAL | Status: DC | PRN

## 2017-10-22 MED ORDER — ACETAMINOPHEN 325 MG PO TABS
325.0000 mg | ORAL_TABLET | Freq: Four times a day (QID) | ORAL | Status: DC | PRN
  Administered 2017-10-25: 650 mg via ORAL
  Filled 2017-10-22: qty 2

## 2017-10-22 MED ORDER — PROPOFOL 10 MG/ML IV BOLUS
INTRAVENOUS | Status: AC
Start: 2017-10-22 — End: ?
  Filled 2017-10-22: qty 20

## 2017-10-22 MED ORDER — DIPHENHYDRAMINE HCL 12.5 MG/5ML PO ELIX: 12.5000 mg | ORAL_SOLUTION | ORAL | Status: DC | PRN

## 2017-10-22 MED ORDER — DEXAMETHASONE SODIUM PHOSPHATE 10 MG/ML IJ SOLN
10.0000 mg | Freq: Once | INTRAMUSCULAR | Status: AC
  Administered 2017-10-23: 10 mg via INTRAVENOUS
  Filled 2017-10-22: qty 1

## 2017-10-22 MED ORDER — ONDANSETRON HCL 4 MG/2ML IJ SOLN: 4.0000 mg | Freq: Once | INTRAMUSCULAR | Status: DC | PRN

## 2017-10-22 MED ORDER — ROPIVACAINE HCL 7.5 MG/ML IJ SOLN
INTRAMUSCULAR | Status: DC | PRN
  Administered 2017-10-22: 20 mL via PERINEURAL

## 2017-10-22 MED ORDER — OXYCODONE HCL 5 MG PO TABS: 5.0000 mg | ORAL_TABLET | ORAL | 0 refills | Status: AC | PRN

## 2017-10-22 MED ORDER — BISACODYL 5 MG PO TBEC: 5.0000 mg | DELAYED_RELEASE_TABLET | Freq: Every day | ORAL | Status: DC | PRN

## 2017-10-22 MED ORDER — KETOROLAC TROMETHAMINE 30 MG/ML IJ SOLN
INTRAMUSCULAR | Status: DC | PRN
  Administered 2017-10-22: 30 mg via INTRAVENOUS

## 2017-10-22 MED ORDER — METHOCARBAMOL 500 MG PO TABS: 500.0000 mg | ORAL_TABLET | Freq: Three times a day (TID) | ORAL | 2 refills | Status: DC | PRN

## 2017-10-22 MED ORDER — LACTATED RINGERS IV SOLN
INTRAVENOUS | Status: DC
  Administered 2017-10-22 (×2): via INTRAVENOUS

## 2017-10-22 MED ORDER — METOCLOPRAMIDE HCL 5 MG/ML IJ SOLN: 5.0000 mg | Freq: Three times a day (TID) | INTRAMUSCULAR | Status: DC | PRN

## 2017-10-22 MED ORDER — SODIUM CHLORIDE 0.9 % IJ SOLN
INTRAMUSCULAR | Status: DC | PRN
  Administered 2017-10-22: 30 mL

## 2017-10-22 MED ORDER — MENTHOL 3 MG MT LOZG: 1.0000 | LOZENGE | OROMUCOSAL | Status: DC | PRN

## 2017-10-22 MED ORDER — ENOXAPARIN SODIUM 30 MG/0.3ML ~~LOC~~ SOLN
30.0000 mg | Freq: Two times a day (BID) | SUBCUTANEOUS | Status: DC
  Administered 2017-10-23 – 2017-10-25 (×5): 30 mg via SUBCUTANEOUS
  Filled 2017-10-22 (×5): qty 0.3

## 2017-10-22 MED ORDER — DEXAMETHASONE SODIUM PHOSPHATE 10 MG/ML IJ SOLN
INTRAMUSCULAR | Status: AC
  Filled 2017-10-22: qty 1

## 2017-10-22 MED ORDER — ASPIRIN EC 325 MG PO TBEC: 325.0000 mg | DELAYED_RELEASE_TABLET | Freq: Two times a day (BID) | ORAL | 0 refills | Status: AC

## 2017-10-22 MED ORDER — ONDANSETRON HCL 4 MG/2ML IJ SOLN
INTRAMUSCULAR | Status: DC | PRN
  Administered 2017-10-22: 4 mg via INTRAVENOUS

## 2017-10-22 SURGICAL SUPPLY — 57 items
BAG DECANTER FOR FLEXI CONT (MISCELLANEOUS) IMPLANT
BAG ZIPLOCK 12X15 (MISCELLANEOUS) ×4 IMPLANT
BANDAGE ACE 4X5 VEL STRL LF (GAUZE/BANDAGES/DRESSINGS) ×2 IMPLANT
BANDAGE ACE 6X5 VEL STRL LF (GAUZE/BANDAGES/DRESSINGS) ×2 IMPLANT
BLADE SAG 18X100X1.27 (BLADE) ×2 IMPLANT
BLADE SAW SGTL 13.0X1.19X90.0M (BLADE) ×2 IMPLANT
BOWL SMART MIX CTS (DISPOSABLE) ×2 IMPLANT
CAP KNEE TOTAL 3 SIGMA ×2 IMPLANT
CEMENT HV SMART SET (Cement) ×2 IMPLANT
COVER SURGICAL LIGHT HANDLE (MISCELLANEOUS) ×2 IMPLANT
CUFF TOURN SGL QUICK 34 (TOURNIQUET CUFF) ×1
CUFF TRNQT CYL 34X4X40X1 (TOURNIQUET CUFF) ×1 IMPLANT
DECANTER SPIKE VIAL GLASS SM (MISCELLANEOUS) ×2 IMPLANT
DERMABOND ADVANCED (GAUZE/BANDAGES/DRESSINGS) ×1
DERMABOND ADVANCED .7 DNX12 (GAUZE/BANDAGES/DRESSINGS) ×1 IMPLANT
DRAPE U-SHAPE 47X51 STRL (DRAPES) ×2 IMPLANT
DRSG AQUACEL AG ADV 3.5X10 (GAUZE/BANDAGES/DRESSINGS) ×2 IMPLANT
DRSG TEGADERM 4X4.75 (GAUZE/BANDAGES/DRESSINGS) ×2 IMPLANT
DURAPREP 26ML APPLICATOR (WOUND CARE) ×4 IMPLANT
ELECT REM PT RETURN 15FT ADLT (MISCELLANEOUS) ×2 IMPLANT
EVACUATOR 1/8 PVC DRAIN (DRAIN) ×2 IMPLANT
GAUZE SPONGE 2X2 8PLY STRL LF (GAUZE/BANDAGES/DRESSINGS) ×1 IMPLANT
GLOVE BIO SURGEON STRL SZ7.5 (GLOVE) ×4 IMPLANT
GLOVE BIOGEL PI IND STRL 8 (GLOVE) ×2 IMPLANT
GLOVE BIOGEL PI INDICATOR 8 (GLOVE) ×2
GLOVE ECLIPSE 8.0 STRL XLNG CF (GLOVE) ×4 IMPLANT
GLOVE SURG ORTHO 9.0 STRL STRW (GLOVE) ×2 IMPLANT
GOWN STRL REUS W/TWL XL LVL3 (GOWN DISPOSABLE) ×4 IMPLANT
HANDPIECE INTERPULSE COAX TIP (DISPOSABLE) ×1
HOLDER FOLEY CATH W/STRAP (MISCELLANEOUS) IMPLANT
IMMOBILIZER KNEE 20 (SOFTGOODS) ×2
IMMOBILIZER KNEE 20 THIGH 36 (SOFTGOODS) ×1 IMPLANT
NS IRRIG 1000ML POUR BTL (IV SOLUTION) ×2 IMPLANT
PACK TOTAL KNEE CUSTOM (KITS) ×2 IMPLANT
POSITIONER SURGICAL ARM (MISCELLANEOUS) ×2 IMPLANT
SET HNDPC FAN SPRY TIP SCT (DISPOSABLE) ×1 IMPLANT
SET PAD KNEE POSITIONER (MISCELLANEOUS) ×2 IMPLANT
SPONGE GAUZE 2X2 STER 10/PKG (GAUZE/BANDAGES/DRESSINGS) ×1
SPONGE LAP 18X18 RF (DISPOSABLE) IMPLANT
SPONGE SURGIFOAM ABS GEL 100 (HEMOSTASIS) ×2 IMPLANT
STOCKINETTE 6  STRL (DRAPES) ×1
STOCKINETTE 6 STRL (DRAPES) ×1 IMPLANT
SUT BONE WAX W31G (SUTURE) IMPLANT
SUT MNCRL AB 3-0 PS2 18 (SUTURE) ×2 IMPLANT
SUT VIC AB 0 CT1 27 (SUTURE) ×2
SUT VIC AB 0 CT1 27XBRD ANTBC (SUTURE) ×2 IMPLANT
SUT VIC AB 1 CT1 27 (SUTURE) ×4
SUT VIC AB 1 CT1 27XBRD ANTBC (SUTURE) ×4 IMPLANT
SUT VIC AB 2-0 CT1 27 (SUTURE) ×3
SUT VIC AB 2-0 CT1 TAPERPNT 27 (SUTURE) ×3 IMPLANT
SUT VLOC 180 0 24IN GS25 (SUTURE) ×2 IMPLANT
SYR 50ML LL SCALE MARK (SYRINGE) ×2 IMPLANT
TAPE STRIPS DRAPE STRL (GAUZE/BANDAGES/DRESSINGS) ×2 IMPLANT
TRAY FOLEY MTR SLVR 16FR STAT (SET/KITS/TRAYS/PACK) ×2 IMPLANT
WATER STERILE IRR 1000ML POUR (IV SOLUTION) ×4 IMPLANT
WRAP KNEE MAXI GEL POST OP (GAUZE/BANDAGES/DRESSINGS) ×2 IMPLANT
YANKAUER SUCT BULB TIP 10FT TU (MISCELLANEOUS) ×2 IMPLANT

## 2017-10-22 NOTE — Progress Notes (Signed)
PACU nursing note  Pt questioned about history of DM . Pt denies DX. Medications list reviewed also no Labs available to evaluate baseline or current blood glucose levels.

## 2017-10-22 NOTE — Interval H&P Note (Signed)
History and Physical Interval Note:  10/22/2017 1:04 PM  Tammy [name]  has presented today for surgery, with the diagnosis of Right knee osteoarthritis  The various methods of treatment have been discussed with the patient and family. After consideration of risks, benefits and other options for treatment, the patient has consented to  Procedure(s): RIGHT TOTAL KNEE ARTHROPLASTY (Right) as a surgical intervention .  The patient's history has been reviewed, patient examined, no change in status, stable for surgery.  I have reviewed the patient's chart and labs.  Questions were answered to the patient's satisfaction.     Jermond Burkemper ANDREW

## 2017-10-22 NOTE — Anesthesia Postprocedure Evaluation (Signed)
Anesthesia Post Note  Patient: Tammy [name]  Procedure(s) Performed: RIGHT TOTAL KNEE ARTHROPLASTY (Right Knee)     Patient location during evaluation: PACU Anesthesia Type: MAC and Spinal Level of consciousness: awake and alert Pain management: pain level controlled Vital Signs Assessment: post-procedure vital signs reviewed and stable Respiratory status: spontaneous breathing and respiratory function stable Cardiovascular status: blood pressure returned to baseline and stable Postop Assessment: spinal receding Anesthetic complications: no    Last Vitals:  Vitals:   10/22/17 2049 10/22/17 2253  BP: (!) 152/100 136/74  Pulse: (!) 57 61  Resp: 16 16  Temp: 36.6 C 36.7 C  SpO2: 100% 98%    Last Pain:  Vitals:   10/22/17 2253  TempSrc: Oral  PainSc:                  Tiajuana Amass

## 2017-10-22 NOTE — Op Note (Signed)
DATE OF SURGERY:  10/22/2017  TIME: 6:13 PM  PATIENT NAME:  Tammy [name]    AGE: 62 y.o.   PRE-OPERATIVE DIAGNOSIS:  Right knee osteoarthritis  POST-OPERATIVE DIAGNOSIS:  Right knee osteoarthritis  PROCEDURE:  Procedure(s): RIGHT TOTAL KNEE ARTHROPLASTY  SURGEON:  Terralyn Matsumura ANDREW  ASSISTANT:  Bryson Stilwell, PA-C, present and scrubbed throughout the case, critical for assistance with exposure, retraction, instrumentation, and closure.  OPERATIVE IMPLANTS: Depuy PFC Sigma Rotating Platform.  Femur size 4, Tibia size 4, Patella size 35 3-peg oval button, with a 10 mm polyethylene insert.   PREOPERATIVE INDICATIONS:   Tammy SolianBeverly J Cuffe is a 62 y.o. year old female with end stage bone on bone arthritis of the knee who failed conservative treatment and elected for Total Knee Arthroplasty.   The risks, benefits, and alternatives were discussed at length including but not limited to the risks of infection, bleeding, nerve injury, stiffness, blood clots, the need for revision surgery, cardiopulmonary complications, among others, and they were willing to proceed.  OPERATIVE DESCRIPTION:  The patient was brought to the operative room and placed in a supine position.  Spinal anesthesia was administered.  IV antibiotics were given.  The lower extremity was prepped and draped in the usual sterile fashion.  Time out was performed.  The leg was elevated and exsanguinated and the tourniquet was inflated.  Anterior quadriceps tendon splitting approach was performed.  The patella was retracted and osteophytes were removed.  The anterior horn of the medial and lateral meniscus was removed and cruciate ligaments resected.   The distal femur was opened with the drill and the intramedullary distal femoral cutting jig was utilized, set at 5 degrees resecting 10 mm off the distal femur.  Care was taken to protect the collateral ligaments.  The distal femoral sizing jig was applied, taking  care to avoid notching.  Then the 4-in-1 cutting jig was applied and the anterior and posterior femur was cut, along with the chamfer cuts.    Then the extramedullary tibial cutting jig was utilized making the appropriate cut using the anterior tibial crest as a reference building in appropriate posterior slope.  Care was taken during the cut to protect the medial and collateral ligaments.  The proximal tibia was removed along with the posterior horns of the menisci.   The posterior medial femoral osteophytes and posterior lateral femoral osteophytes were removed.    The flexion gap was then measured and was symmetric with the extension gap, measured at 10.  I completed the distal femoral preparation using the appropriate jig to prepare the box.  The patella was then measured, and cut with the saw.    The proximal tibia sized and prepared accordingly with the reamer and the punch, and then all components were trialed with the trial insert.  The knee was found to have excellent balance and full motion.    The above named components were then cemented into place and all excess cement was removed.  The trial polyethylene component was in place during cementation, and then was exchanged for the real polyethylene component.    The knee was easily taken through a range of motion and the patella tracked well and the knee irrigated copiously and the parapatellar and subcutaneous tissue closed with vicryl, and monocryl with steri strips for the skin.  The arthrotomy was closed at 90 of flexion. The wounds were dressed with sterile gauze and the tourniquet released and the patient was awakened and returned to the PACU  in stable and satisfactory condition.  There were no complications.  Total tourniquet time was 79 minutes.

## 2017-10-22 NOTE — Transfer of Care (Signed)
Immediate Anesthesia Transfer of Care Note  Patient: Tammy [name]  Procedure(s) Performed: Procedure(s): RIGHT TOTAL KNEE ARTHROPLASTY (Right)  Patient Location: PACU  Anesthesia Type:Spinal  Level of Consciousness:  sedated, patient cooperative and responds to stimulation  Airway & Oxygen Therapy:Patient Spontanous Breathing and Patient connected to face mask oxgen  Post-op Assessment:  Report given to PACU RN and Post -op Vital signs reviewed and stable  Post vital signs:  Reviewed and stable  Last Vitals:  Vitals:   10/22/17 1548 10/22/17 1549  BP:    Pulse: 72 67  Resp: 15 17  Temp:    SpO2: 98% 100%    Complications: No apparent anesthesia complication

## 2017-10-22 NOTE — Anesthesia Procedure Notes (Signed)
Spinal  Patient location during procedure: OR Start time: 10/22/2017 4:00 PM End time: 10/22/2017 4:05 PM Staffing Anesthesiologist: Marcene DuosFitzgerald, Robert, MD Resident/CRNA: Epimenio SarinJarvela, Ulrich Soules R, CRNA Performed: resident/CRNA  Preanesthetic Checklist Completed: patient identified, surgical consent, pre-op evaluation, timeout performed, IV checked, risks and benefits discussed and monitors and equipment checked Spinal Block Patient position: sitting Prep: DuraPrep Patient monitoring: heart rate, cardiac monitor, continuous pulse ox and blood pressure Approach: midline Location: L4-5 Needle Needle type: Pencan  Needle gauge: 24 G Needle length: 10 cm Needle insertion depth: 8.5 cm Assessment Sensory level: T6

## 2017-10-22 NOTE — Anesthesia Procedure Notes (Signed)
Anesthesia Regional Block: Adductor canal block   Pre-Anesthetic Checklist: ,, timeout performed, Correct Patient, Correct Site, Correct Laterality, Correct Procedure, Correct Position, site marked, Risks and benefits discussed,  Surgical consent,  Pre-op evaluation,  At surgeon's request and post-op pain management  Laterality: Right  Prep: chloraprep       Needles:  Injection technique: Single-shot  Needle Type: Echogenic Needle     Needle Length: 9cm  Needle Gauge: 21     Additional Needles:   Procedures:,,,, ultrasound used (permanent image in chart),,,,  Narrative:  Start time: 10/22/2017 3:25 PM End time: 10/22/2017 3:31 PM Injection made incrementally with aspirations every 5 mL.  Performed by: Personally  Anesthesiologist: Marcene DuosFitzgerald, Rody Keadle, MD

## 2017-10-22 NOTE — Progress Notes (Signed)
AssistedDr. Rob Fitzgerald with right, ultrasound guided, adductor canal block. Side rails up, monitors on throughout procedure. See vital signs in flow sheet. Tolerated Procedure well.  

## 2017-10-22 NOTE — Anesthesia Preprocedure Evaluation (Addendum)
Anesthesia Evaluation  Patient identified by MRN, date of birth, ID band Patient awake    Reviewed: Allergy & Precautions, NPO status , Patient's Chart, lab work & pertinent test results  Airway Mallampati: II  TM Distance: >3 FB Neck ROM: Full    Dental   Pulmonary neg pulmonary ROS,    breath sounds clear to auscultation       Cardiovascular negative cardio ROS   Rhythm:Regular Rate:Normal     Neuro/Psych negative neurological ROS     GI/Hepatic Neg liver ROS, GERD  Medicated,  Endo/Other  diabetes, Type 2  Renal/GU negative Renal ROS     Musculoskeletal   Abdominal   Peds  Hematology negative hematology ROS (+)   Anesthesia Other Findings   Reproductive/Obstetrics                             Anesthesia Physical Anesthesia Plan  ASA: II  Anesthesia Plan: Spinal and MAC   Post-op Pain Management:  Regional for Post-op pain   Induction: Intravenous  PONV Risk Score and Plan: 2 and Propofol infusion, Ondansetron, Treatment may vary due to age or medical condition and Midazolam  Airway Management Planned: Natural Airway and Simple Face Mask  Additional Equipment:   Intra-op Plan:   Post-operative Plan:   Informed Consent: I have reviewed the patients History and Physical, chart, labs and discussed the procedure including the risks, benefits and alternatives for the proposed anesthesia with the patient or authorized representative who has indicated his/her understanding and acceptance.     Plan Discussed with:   Anesthesia Plan Comments:        Anesthesia Quick Evaluation

## 2017-10-23 LAB — CBC
HCT: 36.6 % (ref 36.0–46.0)
Hemoglobin: 12.1 g/dL (ref 12.0–15.0)
MCH: 30.6 pg (ref 26.0–34.0)
MCHC: 33.1 g/dL (ref 30.0–36.0)
MCV: 92.7 fL (ref 78.0–100.0)
PLATELETS: 188 10*3/uL (ref 150–400)
RBC: 3.95 MIL/uL (ref 3.87–5.11)
RDW: 13.1 % (ref 11.5–15.5)
WBC: 12.4 10*3/uL — ABNORMAL HIGH (ref 4.0–10.5)

## 2017-10-23 LAB — BASIC METABOLIC PANEL
Anion gap: 8 (ref 5–15)
BUN: 10 mg/dL (ref 8–23)
CALCIUM: 8.9 mg/dL (ref 8.9–10.3)
CO2: 25 mmol/L (ref 22–32)
Chloride: 109 mmol/L (ref 98–111)
Creatinine, Ser: 0.59 mg/dL (ref 0.44–1.00)
GFR calc Af Amer: >60 mL/min (ref >60)
GLUCOSE: 143 mg/dL — AB (ref 70–99)
Potassium: 4.2 mmol/L (ref 3.5–5.1)
Sodium: 142 mmol/L (ref 135–145)

## 2017-10-23 NOTE — Progress Notes (Signed)
    Subjective: 1 Day Post-Op Procedure(s) (LRB): RIGHT TOTAL KNEE ARTHROPLASTY (Right) Patient reports pain as 0 on 0-10 scale.   Denies CP or SOB.  Voiding without difficulty. Positive flatus.  Pt eating well.  Objective: Vital signs in last 24 hours: Temp:  [97.7 F (36.5 C)-98.5 F (36.9 C)] 97.7 F (36.5 C) (07/06 0556) Pulse Rate:  [53-80] 56 (07/06 0556) Resp:  [7-25] 16 (07/06 0556) BP: (121-153)/(70-100) 121/89 (07/06 0556) SpO2:  [97 %-100 %] 100 % (07/06 0556) Weight:  [107 kg (236 lb)] 107 kg (236 lb) (07/05 1343)  Intake/Output from previous day: 07/05 0701 - 07/06 0700 In: 3068.8 [I.V.:2403.8; IV Piggyback:665] Out: 3725 [Urine:3625; Blood:100] Intake/Output this shift: No intake/output data recorded.  Labs: Recent Labs    10/23/17 0514  HGB 12.1   Recent Labs    10/23/17 0514  WBC 12.4*  RBC 3.95  HCT 36.6  PLT 188   Recent Labs    10/23/17 0514  NA 142  K 4.2  CL 109  CO2 25  BUN 10  CREATININE 0.59  GLUCOSE 143*  CALCIUM 8.9   No results for input(s): LABPT, INR in the last 72 hours.  Physical Exam: Neurologically intact ABD soft Sensation intact distally Dorsiflexion/Plantar flexion intact Incision: dressing C/D/I Compartment soft Body mass index is 39.88 kg/m. Ace wrap and Ice in place  Assessment/Plan: 1 Day Post-Op Procedure(s) (LRB): RIGHT TOTAL KNEE ARTHROPLASTY (Right) Advance diet Up with therapy  Pt to DC to Rehab Sunday or Monday  Gabriana Wilmott, Baxter Kailarmen Christina for [facility]ari [facility] [phone] 10/23/2017, 8:09 AM

## 2017-10-23 NOTE — Evaluation (Signed)
Physical Therapy Evaluation Patient Details Name: Tammy [name] MRN: [id number] DOB: 05/26/1955 Today's Date: 10/23/2017   History of Present Illness  R TKA  Clinical Impression  Pt is s/p TKA resulting in the deficits listed below (see PT Problem List). Pt ambulated 8845' with RW with no loss of balance. Instructed pt in TKA HEP. Good progress expected.  Pt will benefit from skilled PT to increase their independence and safety with mobility to allow discharge to the venue listed below.      Follow Up Recommendations Follow surgeon's recommendation for DC plan and follow-up therapies(pt reports she's going to [facility])    Equipment Recommendations  Rolling walker with 5" wheels;3in1 (PT)    Recommendations for Other Services       Precautions / Restrictions Precautions Precautions: Knee Precaution Booklet Issued: Yes (comment) Precaution Comments: instructed pt in no pillow under knee Restrictions Weight Bearing Restrictions: No Other Position/Activity Restrictions: WBAT      Mobility  Bed Mobility Overal bed mobility: Needs Assistance Bed Mobility: Supine to Sit     Supine to sit: Min assist;HOB elevated     General bed mobility comments: min A to support RLE  Transfers Overall transfer level: Needs assistance Equipment used: Rolling walker (2 wheeled) Transfers: Sit to/from Stand Sit to Stand: Min assist;From elevated surface         General transfer comment: VCs hand placement, min A to rise  Ambulation/Gait Ambulation/Gait assistance: Min guard Gait Distance (Feet): 45 Feet Assistive device: Rolling walker (2 wheeled) Gait Pattern/deviations: Step-to pattern;Decreased step length - left;Decreased step length - right;Antalgic Gait velocity: decr   General Gait Details: VCs sequencing, no loss of balance  Stairs            Wheelchair Mobility    Modified Rankin (Stroke Patients Only)       Balance Overall balance assessment: Modified  Independent                                           Pertinent Vitals/Pain Pain Assessment: 0-10 Pain Score: 7  Pain Location: R knee with exercises Pain Descriptors / Indicators: Sore Pain Intervention(s): Limited activity within patient's tolerance;Monitored during session;Premedicated before session;Ice applied    Home Living Family/patient expects to be discharged to:: Private residence Living Arrangements: Spouse/significant other;Other relatives(sister) Available Help at Discharge: Available 24 hours/day;Family   Home Access: Stairs to enter Entrance Stairs-Rails: Right Entrance Stairs-Number of Steps: 4 Home Layout: Laundry or work area in basement Home Equipment: Environmental consultantWalker - 4 wheels;Cane - single point      Prior Function Level of Independence: Independent               Hand Dominance        Extremity/Trunk Assessment   Upper Extremity Assessment Upper Extremity Assessment: Overall WFL for tasks assessed    Lower Extremity Assessment Lower Extremity Assessment: RLE deficits/detail RLE Deficits / Details: SLR 3/5, AAROM 5-55* knee RLE Sensation: WNL    Cervical / Trunk Assessment Cervical / Trunk Assessment: Normal  Communication   Communication: No difficulties  Cognition Arousal/Alertness: Awake/alert Behavior During Therapy: WFL for tasks assessed/performed Overall Cognitive Status: Within Functional Limits for tasks assessed  General Comments      Exercises Total Joint Exercises Ankle Circles/Pumps: AROM;Both;10 reps;Supine Quad Sets: AROM;Right;5 reps;Supine Short Arc Quad: AAROM;Right;10 reps;Supine Heel Slides: AAROM;Right;10 reps;Supine Hip ABduction/ADduction: AAROM;Right;10 reps;Supine Straight Leg Raises: AAROM;AROM;Right;10 reps;Supine Goniometric ROM: 5-55* AAROM R knee   Assessment/Plan    PT Assessment Patient needs continued PT services  PT Problem  List Decreased strength;Decreased range of motion;Decreased activity tolerance;Decreased mobility;Pain;Decreased knowledge of use of DME       PT Treatment Interventions DME instruction;Gait training;Functional mobility training;Therapeutic activities;Therapeutic exercise;Patient/family education    PT Goals (Current goals can be found in the Care Plan section)  Acute Rehab PT Goals 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 : Unable Difficulty sitting down on and standing up from a chair with arms (e.g., wheelchair, bedside commode, etc,.)?: Unable Help needed moving to and from a bed to chair (including a wheelchair)?: A Little Help needed walking in hospital room?: A Little Help needed climbing 3-5 steps with a railing? : A Lot 6 Click Score: 12    End of Session Equipment Utilized During Treatment: Gait belt Activity Tolerance: Patient tolerated treatment well Patient left: in chair;with call bell/phone within reach;with family/visitor present Nurse Communication: Mobility status PT Visit Diagnosis: Difficulty in walking, not elsewhere classified (R26.2);Muscle weakness (generalized) (M62.81);Pain Pain - Right/Left: Right Pain - part of body: Knee    Time: 0808-0901 PT Time Calculation (min) (ACUTE ONLY): 53 min   Charges:   PT Evaluation $PT Eval Low Complexity: 1 Low PT Treatments $Gait Training: 8-22 mins $Therapeutic Exercise: 8-22 mins $Therapeutic Activity: 8-22 mins   PT G Codes:          Ralene Bathe Kistler 10/23/2017, 9:08 AM [phone]

## 2017-10-23 NOTE — Progress Notes (Signed)
Physical Therapy Treatment Patient Details Name: Tammy SolianBeverly J Garlick MRN: [id number] DOB: 1955-12-30 Today's Date: 10/23/2017    History of Present Illness R TKA    PT Comments    Pt progressing well with mobility, she ambulated 100' with RW. Performed R TKA exercises well. AAROM R knee 5-90*.   Follow Up Recommendations  Follow surgeon's recommendation for DC plan and follow-up therapies(pt reports she's going to [facility])     Equipment Recommendations  Rolling walker with 5" wheels;3in1 (PT)    Recommendations for Other Services       Precautions / Restrictions Precautions Precautions: Knee Precaution Booklet Issued: Yes (comment) Precaution Comments: instructed pt in no pillow under knee Restrictions Weight Bearing Restrictions: No Other Position/Activity Restrictions: WBAT    Mobility  Bed Mobility               General bed mobility comments: up on edge of bed  Transfers Overall transfer level: Needs assistance Equipment used: Rolling walker (2 wheeled) Transfers: Sit to/from Stand Sit to Stand: Min guard         General transfer comment: VCs hand placement,  Ambulation/Gait Ambulation/Gait assistance: Min guard Gait Distance (Feet): 100 Feet Assistive device: Rolling walker (2 wheeled) Gait Pattern/deviations: Step-to pattern;Decreased step length - left;Decreased step length - right;Antalgic Gait velocity: decr   General Gait Details: VCs sequencing, no loss of balance   Stairs             Wheelchair Mobility    Modified Rankin (Stroke Patients Only)       Balance Overall balance assessment: Modified Independent                                          Cognition Arousal/Alertness: Awake/alert Behavior During Therapy: WFL for tasks assessed/performed Overall Cognitive Status: Within Functional Limits for tasks assessed                                        Exercises Total Joint  Exercises Ankle Circles/Pumps: AROM;Both;10 reps;Seated Long Arc Quad: AROM;Right;10 reps;Seated Knee Flexion: AAROM;AROM;Right;10 reps;Seated Goniometric ROM: 5-90* AAROM R knee    General Comments        Pertinent Vitals/Pain Pain Score: 5  Pain Location: R knee with exercises Pain Descriptors / Indicators: Sore Pain Intervention(s): Limited activity within patient's tolerance;Monitored during session;Premedicated before session;Ice applied    Home Living                      Prior Function            PT Goals (current goals can now be found in the care plan section) Acute Rehab PT Goals 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 : Unable Difficulty sitting down on and standing up from a chair with arms (e.g., wheelchair, bedside commode, etc,.)?: Unable Help needed moving to and from a bed to chair (including a wheelchair)?: A Little Help needed walking in hospital room?: A Little Help needed climbing 3-5 steps with a railing? : A Lot 6 Click Score: 12    End of Session Equipment Utilized During Treatment: Gait belt Activity Tolerance: Patient tolerated treatment well Patient left: with call bell/phone within reach;with family/visitor present;in bed;with nursing/sitter in room Nurse Communication: Mobility status PT Visit Diagnosis: Difficulty in walking, not elsewhere classified (R26.2);Muscle weakness (generalized)  (M62.81);Pain Pain - Right/Left: Right Pain - part of body: Knee     Time: 1610-9604 PT Time Calculation (min) (ACUTE ONLY): 35 min  Charges:  $Gait Training: 8-22 mins $Therapeutic Exercise: 8-22 mins                    G Codes:          Ralene Bathe Kistler 10/23/2017, 1:13 PM [phone]

## 2017-10-24 LAB — CBC
HEMATOCRIT: 33 % — AB (ref 36.0–46.0)
Hemoglobin: 10.9 g/dL — ABNORMAL LOW (ref 12.0–15.0)
MCH: 30.9 pg (ref 26.0–34.0)
MCHC: 33 g/dL (ref 30.0–36.0)
MCV: 93.5 fL (ref 78.0–100.0)
Platelets: 162 10*3/uL (ref 150–400)
RBC: 3.53 MIL/uL — ABNORMAL LOW (ref 3.87–5.11)
RDW: 13.5 % (ref 11.5–15.5)
WBC: 9.2 10*3/uL (ref 4.0–10.5)

## 2017-10-24 NOTE — Progress Notes (Signed)
Subjective: 2 Days Post-Op Procedure(s) (LRB): RIGHT TOTAL KNEE ARTHROPLASTY (Right) Patient reports pain as 4 on 0-10 scale.    Objective: Vital signs in last 24 hours: Temp:  [97.5 F (36.4 C)-98.3 F (36.8 C)] 98.3 F (36.8 C) (07/07 0545) Pulse Rate:  [61-71] 71 (07/07 0545) Resp:  [18] 18 (07/07 0545) BP: (119-143)/(65-74) 143/65 (07/07 0545) SpO2:  [97 %-100 %] 99 % (07/07 0545)  Intake/Output from previous day: 07/06 0701 - 07/07 0700 In: 1777.5 [P.O.:1320; I.V.:457.5] Out: 850 [Urine:850] Intake/Output this shift: No intake/output data recorded.  Recent Labs    10/23/17 0514 10/24/17 0445  HGB 12.1 10.9*   Recent Labs    10/23/17 0514 10/24/17 0445  WBC 12.4* 9.2  RBC 3.95 3.53*  HCT 36.6 33.0*  PLT 188 162   Recent Labs    10/23/17 0514  NA 142  K 4.2  CL 109  CO2 25  BUN 10  CREATININE 0.59  GLUCOSE 143*  CALCIUM 8.9   No results for input(s): LABPT, INR in the last 72 hours.  Neurovascular intact Sensation intact distally Intact pulses distally Dorsiflexion/Plantar flexion intact Incision: dressing C/D/I         Assessment/Plan: 2 Days Post-Op Procedure(s) (LRB): RIGHT TOTAL KNEE ARTHROPLASTY (Right) Advance diet  Up with therapy - WBAT to RLE Continue with pain management as needed.  Plan to d/c to rehab Monday.    Karma GreaserSamantha Bonham Barton 10/24/2017, 11:10 AM

## 2017-10-24 NOTE — Care Management Note (Signed)
Case Management Note  Patient Details  Name: Francella SolianBeverly J Whipkey MRN: [id number] Date of Birth: 06/24/1955  Subjective/Objective:      Right TKA              Action/Plan: NCM spoke to pt at bedside. Pt requesting tub bench and 3n1 bc. States she has RW and shower chair at home. She is Worker's Comp, [employer] [place] # [id number] fax [phone] claim # [id number]. [facility] and [facility], H&P and op note. NCM will need to fax dc summary at time of dc.   Expected Discharge Date:  10/22/17               Expected Discharge Plan:  Home w Home Health Services  In-House Referral:  Clinical Social Work  Discharge planning Services  CM Consult  Post Acute Care Choice:  Home Health Choice offered to:     DME Arranged:  3-N-1, Tub bench DME Agency:  [facility]  [facility]  [facility]     Status of Service:  In process, will continue to follow  If discussed at Long Length of Stay Meetings, dates discussed:    Additional Comments:  Elliot CousinShavis, Starnisha Batrez Ellen, RN 10/24/2017, 5:11 PM

## 2017-10-24 NOTE — Progress Notes (Signed)
CSW acknowledging previous recommendation and consult for SNF placement. However, per chart review, it seems that patient has improved to home health PT recommendation and has sister available for 24/7 assist. CSW attempted to meet with patient today to discuss discharge plan, but patient was working with therapies.  Please consult CSW if plans change. For now, appears that patient will go home with family support. CSW signing off.  Blenda NicelyElizabeth Brandyn Thien, LCSW Clinical Social Worker [phone] (Weekend Coverage)

## 2017-10-24 NOTE — Discharge Summary (Signed)
Physician Discharge Summary  Patient ID: Tammy [name] MRN: [id number] DOB/AGE: [birth year] 62 y.o.  Admit date: 10/22/2017 Discharge date: 10/25/17  Admission Diagnoses: Right knee primary OA  Discharge Diagnoses:  Active Problems:   Osteoarthritis of right knee   S/P knee replacement   Discharged Condition: good  Hospital Course:  ZOA DOWTY is a 62 y.o. who was admitted to [facility]. They were brought to the operating room on 10/22/2017 and underwent Procedure(s): RIGHT TOTAL KNEE ARTHROPLASTY.  Patient tolerated the procedure well and was later transferred to the recovery room and then to the orthopaedic floor for postoperative care.  They were given PO and IV analgesics for pain control following their surgery.  They were given 24 hours of postoperative antibiotics of  Anti-infectives (From admission, onward)   Start     Dose/Rate Route Frequency Ordered Stop   10/23/17 0400  vancomycin (VANCOCIN) IVPB 1000 mg/200 mL premix     1,000 mg 200 mL/hr over 60 Minutes Intravenous Every 12 hours 10/22/17 1934 10/23/17 0500   10/22/17 0600  vancomycin (VANCOCIN) 1,500 mg in sodium chloride 0.9 % 500 mL IVPB     1,500 mg 250 mL/hr over 120 Minutes Intravenous On call to O.R. 10/21/17 1004 10/22/17 1731     and started on DVT prophylaxis in the form of lovenox.   PT and OT were ordered for total joint protocol.  Discharge planning consulted to help with postop disposition and equipment needs.  Patient had a good night on the evening of surgery and started to get up OOB with therapy on day one.  Hemovac drain was pulled without difficulty.  Continued to work with therapy into day two.  Dressing was with normal limits.  The patient had progressed with therapy and meeting their goals. Patient was seen in rounds and was ready to go home.  Consults: n/a  Significant Diagnostic Studies: routine Treatments: routine  Discharge Exam: Blood pressure 138/67, pulse 80,  temperature 98.9 F (37.2 C), temperature source Oral, resp. rate 14, height 5' 4.5" (1.638 m), weight 107 kg (236 lb), SpO2 100 %. Well nourished. Alert and oriented x3. RRR, Lungs clear, BS x4. Abdomen soft and non tender. Right Calf soft and non tender. Right knee dressing C/D/I. No DVT signs. Compartment soft. No signs of infection.  Right LE grossly neurovascular intact. Disposition:   Discharge Instructions    Call MD / Call 911   Complete by:  As directed    If you experience chest pain or shortness of breath, CALL 911 and be transported to the hospital emergency room.  If you develope a fever above 101 F, pus (white drainage) or increased drainage or redness at the wound, or calf pain, call your surgeon's office.   Constipation Prevention   Complete by:  As directed    Drink plenty of fluids.  Prune juice may be helpful.  You may use a stool softener, such as Colace (over the counter) 100 mg twice a day.  Use MiraLax (over the counter) for constipation as needed.   Diet - low sodium heart healthy   Complete by:  As directed    Discharge instructions   Complete by:  As directed    INSTRUCTIONS AFTER JOINT REPLACEMENT   Remove items at home which could result in a fall. This includes throw rugs or furniture in walking pathways ICE to the affected joint every three hours while awake for 30 minutes at a time, for at least the  first 3-5 days, and then as needed for pain and swelling.  Continue to use ice for pain and swelling. You may notice swelling that will progress down to the foot and ankle.  This is normal after surgery.  Elevate your leg when you are not up walking on it.   Continue to use the breathing machine you got in the hospital (incentive spirometer) which will help keep your temperature down.  It is common for your temperature to cycle up and down following surgery, especially at night when you are not up moving around and exerting yourself.  The breathing machine keeps your  lungs expanded and your temperature down.   DIET:  As you were doing prior to hospitalization, we recommend a well-balanced diet.  DRESSING / WOUND CARE / SHOWERING  Keep the surgical dressing until follow up.  The dressing is water proof, so you can shower without any extra covering.  IF THE DRESSING FALLS OFF or the wound gets wet inside, change the dressing with sterile gauze.  Please use good hand washing techniques before changing the dressing.  Do not use any lotions or creams on the incision until instructed by your surgeon.    ACTIVITY  Increase activity slowly as tolerated, but follow the weight bearing instructions below.   No driving for 6 weeks or until further direction given by your physician.  You cannot drive while taking narcotics.  No lifting or carrying greater than 10 lbs. until further directed by your surgeon. Avoid periods of inactivity such as sitting longer than an hour when not asleep. This helps prevent blood clots.  You may return to work once you are authorized by your doctor.     WEIGHT BEARING   Weight bearing as tolerated with assist device (walker, cane, etc) as directed, use it as long as suggested by your surgeon or therapist, typically at least 4-6 weeks.   EXERCISES  Results after joint replacement surgery are often greatly improved when you follow the exercise, range of motion and muscle strengthening exercises prescribed by your doctor. Safety measures are also important to protect the joint from further injury. Any time any of these exercises cause you to have increased pain or swelling, decrease what you are doing until you are comfortable again and then slowly increase them. If you have problems or questions, call your caregiver or physical therapist for advice.   Rehabilitation is important following a joint replacement. After just a few days of immobilization, the muscles of the leg can become weakened and shrink (atrophy).  These exercises are  designed to build up the tone and strength of the thigh and leg muscles and to improve motion. Often times heat used for twenty to thirty minutes before working out will loosen up your tissues and help with improving the range of motion but do not use heat for the first two weeks following surgery (sometimes heat can increase post-operative swelling).   These exercises can be done on a training (exercise) mat, on the floor, on a table or on a bed. Use whatever works the best and is most comfortable for you.    Use music or television while you are exercising so that the exercises are a pleasant break in your day. This will make your life better with the exercises acting as a break in your routine that you can look forward to.   Perform all exercises about fifteen times, three times per day or as directed.  You should exercise both the  operative leg and the other leg as well.   Exercises include:   Quad Sets - Tighten up the muscle on the front of the thigh (Quad) and hold for 5-10 seconds.   Straight Leg Raises - With your knee straight (if you were given a brace, keep it on), lift the leg to 60 degrees, hold for 3 seconds, and slowly lower the leg.  Perform this exercise against resistance later as your leg gets stronger.  Leg Slides: Lying on your back, slowly slide your foot toward your buttocks, bending your knee up off the floor (only go as far as is comfortable). Then slowly slide your foot back down until your leg is flat on the floor again.  Angel Wings: Lying on your back spread your legs to the side as far apart as you can without causing discomfort.  Hamstring Strength:  Lying on your back, push your heel against the floor with your leg straight by tightening up the muscles of your buttocks.  Repeat, but this time bend your knee to a comfortable angle, and push your heel against the floor.  You may put a pillow under the heel to make it more comfortable if necessary.   A rehabilitation program  following joint replacement surgery can speed recovery and prevent re-injury in the future due to weakened muscles. Contact your doctor or a physical therapist for more information on knee rehabilitation.    CONSTIPATION  Constipation is defined medically as fewer than three stools per week and severe constipation as less than one stool per week.  Even if you have a regular bowel pattern at home, your normal regimen is likely to be disrupted due to multiple reasons following surgery.  Combination of anesthesia, postoperative narcotics, change in appetite and fluid intake all can affect your bowels.   YOU MUST use at least one of the following options; they are listed in order of increasing strength to get the job done.  They are all available over the counter, and you may need to use some, POSSIBLY even all of these options:    Drink plenty of fluids (prune juice may be helpful) and high fiber foods Colace 100 mg by mouth twice a day  Senokot for constipation as directed and as needed Dulcolax (bisacodyl), take with full glass of water  Miralax (polyethylene glycol) once or twice a day as needed.  If you have tried all these things and are unable to have a bowel movement in the first 3-4 days after surgery call either your surgeon or your primary doctor.    If you experience loose stools or diarrhea, hold the medications until you stool forms back up.  If your symptoms do not get better within 1 week or if they get worse, check with your doctor.  If you experience "the worst abdominal pain ever" or develop nausea or vomiting, please contact the office immediately for further recommendations for treatment.   ITCHING:  If you experience itching with your medications, try taking only a single pain pill, or even half a pain pill at a time.  You can also use Benadryl over the counter for itching or also to help with sleep.   TED HOSE STOCKINGS:  Use stockings on both legs until for at least 2 weeks  or as directed by physician office. They may be removed at night for sleeping.  MEDICATIONS:  See your medication summary on the "After Visit Summary" that nursing will review with you.  You may have  some home medications which will be placed on hold until you complete the course of blood thinner medication.  It is important for you to complete the blood thinner medication as prescribed.  PRECAUTIONS:  If you experience chest pain or shortness of breath - call 911 immediately for transfer to the hospital emergency department.   If you develop a fever greater that 101 F, purulent drainage from wound, increased redness or drainage from wound, foul odor from the wound/dressing, or calf pain - CONTACT YOUR SURGEON.                                                   FOLLOW-UP APPOINTMENTS:  If you do not already have a post-op appointment, please call the office for an appointment to be seen by your surgeon.  Guidelines for how soon to be seen are listed in your "After Visit Summary", but are typically between 1-4 weeks after surgery.  OTHER INSTRUCTIONS:   Knee Replacement:  Do not place pillow under knee, focus on keeping the knee straight while resting. CPM instructions: 0-90 degrees, 2 hours in the morning, 2 hours in the afternoon, and 2 hours in the evening. Place foam block, curve side up under heel at all times except when in CPM or when walking.  DO NOT modify, tear, cut, or change the foam block in any way.  MAKE SURE YOU:  Understand these instructions.  Get help right away if you are not doing well or get worse.    Thank you for letting us be a part of your medical care team.  It is a privilege we respect greatly.  We hope these instructions will help you stay on track for a fast and full recovery!   Increase activity slowly as tolerated   Complete by:  As directed      Allergies as of 10/24/2017      Reactions   Propoxyphene Hcl    hallucinations   Amoxicillin Rash   Carisprodol  [carisoprodol] Itching, Rash   Doxycycline Itching, Rash   Penicillins Rash   Has patient had a PCN reaction causing immediate rash, facial/tongue/throat swelling, SOB or lightheadedness with hypotension: yes Has patient had a PCN reaction causing severe rash involving mucus membranes or skin necrosis: no Has patient had a PCN reaction that required hospitalization: no Has patient had a PCN reaction occurring within the last 10 years: yes If all of the above answers are "NO", then may proceed with Cephalosporin use.      Medication List    TAKE these medications   acetaminophen 500 MG tablet Commonly known as:  TYLENOL Take 500 mg by mouth 2 (two) times daily as needed for mild pain or headache.   aspirin EC 325 MG tablet Take 1 tablet (325 mg total) by mouth 2 (two) times daily.   celecoxib 200 MG capsule Commonly known as:  CELEBREX Take 200 mg by mouth every morning.   cholecalciferol 1000 units tablet Commonly known as:  VITAMIN D Take 1,000 Units by mouth daily.   loratadine 10 MG tablet Commonly known as:  CLARITIN Take 10 mg by mouth every morning.   methocarbamol 500 MG tablet Commonly known as:  ROBAXIN Take 1 tablet (500 mg total) by mouth every 8 (eight) hours as needed for muscle spasms.   MULTIVITAMIN PO Take 1 tablet  by mouth every morning. Women's Centrum Silver   omeprazole 20 MG capsule Commonly known as:  PRILOSEC Take 20 mg by mouth daily.   oxyCODONE 5 MG immediate release tablet Commonly known as:  ROXICODONE Take 1 tablet (5 mg total) by mouth every 4 (four) hours as needed.   sodium bicarbonate 650 MG tablet Take 650-1,300 mg by mouth See admin instructions. Pt alternates 1 tablet every other day with 2 tablets every other day   vitamin C 1000 MG tablet Take 1,000 mg by mouth daily.            Durable Medical Equipment  (From admission, onward)        Start     Ordered   10/24/17 1041  For home use only DME Tub bench  Once      10/24/17 1040   10/23/17 0950  For home use only DME 3 n 1  Once     10/23/17 0951   10/23/17 0950  For home use only DME Walker rolling  Once    Question:  Patient needs a walker to treat with the following condition  Answer:  S/P total knee arthroplasty, right   10/23/17 0951       Signed: Markham Jordan 10/24/2017, 4:32 PM

## 2017-10-24 NOTE — Progress Notes (Signed)
Physical Therapy Treatment Patient Details Name: SHANESSA HODAK MRN: [id number] DOB: 10-03-1955 Today's Date: 10/24/2017    History of Present Illness R TKA    PT Comments    Pt is progressing well with mobility, she completed stair training with assistance from her family. From PT standpoint, she is ready to DC home.   Follow Up Recommendations  Follow surgeon's recommendation for DC plan and follow-up therapies; HHPT recommended     Equipment Recommendations  Rolling walker with 5" wheels;3in1 (PT)    Recommendations for Other Services       Precautions / Restrictions Precautions Precautions: Knee Precaution Booklet Issued: Yes (comment) Precaution Comments: instructed pt in no pillow under knee Restrictions Weight Bearing Restrictions: No Other Position/Activity Restrictions: WBAT    Mobility  Bed Mobility   Bed Mobility: Sit to Supine       Sit to supine: Min assist   General bed mobility comments: min A for LE into bed  Transfers Overall transfer level: Needs assistance Equipment used: Rolling walker (2 wheeled) Transfers: Sit to/from Stand Sit to Stand: Supervision         General transfer comment: VCs hand placement  Ambulation/Gait Ambulation/Gait assistance: Min guard Gait Distance (Feet): 60 Feet Assistive device: Rolling walker (2 wheeled) Gait Pattern/deviations: Step-to pattern;Decreased step length - left;Decreased step length - right;Antalgic Gait velocity: decr   General Gait Details: good sequencing, no loss of balance   Stairs Stairs: Yes Stairs assistance: Min assist Stair Management: One rail Right;Backwards;Forwards Number of Stairs: 3 General stair comments: Practiced 3 steps up and down x 2 trials (once backwards with RW, once with cane and rail), and 2 steps up and down backwareds x 2 trials with spouse/sister assisting   Wheelchair Mobility    Modified Rankin (Stroke Patients Only)       Balance Overall balance  assessment: Modified Independent                                          Cognition Arousal/Alertness: Awake/alert Behavior During Therapy: WFL for tasks assessed/performed Overall Cognitive Status: Within Functional Limits for tasks assessed                                        Exercises     General Comments        Pertinent Vitals/Pain Pain Assessment: Faces Pain Score: 7  Faces Pain Scale: Hurts a little bit Pain Location: R knee with movement Pain Descriptors / Indicators: Sore;Sharp Pain Intervention(s): Limited activity within patient's tolerance;Monitored during session;Patient requesting pain meds-RN notified;Ice applied    Home Living Family/patient expects to be discharged to:: Private residence Living Arrangements: Spouse/significant other;Other relatives Available Help at Discharge: Available 24 hours/day;Family         Home Equipment: Walker - 4 wheels;Cane - single point Additional Comments: ordered 3:1 and tub bench    Prior Function Level of Independence: Independent          PT Goals (current goals can now be found in the care plan section) Acute Rehab PT Goals Patient Stated 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 : Unable Difficulty sitting down on and standing up from a chair with arms (e.g., wheelchair, bedside commode, etc,.)?: Unable Help needed moving to and from a bed to chair  (including a wheelchair)?: A Little Help needed walking in hospital room?: A Little Help needed climbing 3-5 steps with a railing? : A Lot 6 Click Score: 12    End of Session Equipment Utilized During Treatment: Gait belt Activity Tolerance: Patient tolerated treatment well Patient left: with call bell/phone within reach;with family/visitor present;in chair Nurse Communication: Mobility status;Patient requests pain meds PT Visit Diagnosis: Difficulty in walking, not elsewhere classified (R26.2);Muscle weakness (generalized) (M62.81);Pain Pain - Right/Left: Right Pain - part of body: Knee     Time: 1207-1252 PT Time Calculation (min) (ACUTE ONLY): 45 min  Charges:  $Gait Training: 8-22 mins $Therapeutic Exercise: 23-37 mins $Therapeutic Activity: 23-37 mins                    G Codes:          Tamala SerUhlenberg, Cici Rodriges Kistler 10/24/2017, 12:58 PM [phone]

## 2017-10-24 NOTE — Progress Notes (Addendum)
Physical Therapy Treatment Patient Details Name: Tammy SolianBeverly J Endsley MRN: [id number] DOB: Aug 24, 1955 Today's Date: 10/24/2017    History of Present Illness R TKA    PT Comments    Pt is progressing well with mobility, she notes increased pain today but still tolerated 120' of ambulation. Performed TKA HEP with good effort. Pt reports she'd initially planned to DC to SNF because she didn't know if she'd have 24* help at home. Her sister is visiting from [place] for a few weeks and can provide 24* help. From a mobility standpoint, pt is doing well enough to DC home with HHPT. Pt/family would like time to consider this option.   Follow Up Recommendations  Follow surgeon's recommendation for DC plan and follow-up therapies; HHPT recommended     Equipment Recommendations  Transfer tub bench;3in1 (PT)    Recommendations for Other Services       Precautions / Restrictions Precautions Precautions: Knee Precaution Booklet Issued: Yes (comment) Precaution Comments: instructed pt in no pillow under knee Restrictions Weight Bearing Restrictions: No Other Position/Activity Restrictions: WBAT    Mobility  Bed Mobility               General bed mobility comments: up in chair  Transfers Overall transfer level: Needs assistance Equipment used: Rolling walker (2 wheeled) Transfers: Sit to/from Stand Sit to Stand: Min guard         General transfer comment: VCs hand placement  Ambulation/Gait Ambulation/Gait assistance: Min guard Gait Distance (Feet): 120 Feet Assistive device: Rolling walker (2 wheeled) Gait Pattern/deviations: Step-to pattern;Decreased step length - left;Decreased step length - right;Antalgic Gait velocity: decr   General Gait Details: good sequencing, no loss of balance   Stairs             Wheelchair Mobility    Modified Rankin (Stroke Patients Only)       Balance Overall balance assessment: Modified Independent                                           Cognition Arousal/Alertness: Awake/alert Behavior During Therapy: WFL for tasks assessed/performed Overall Cognitive Status: Within Functional Limits for tasks assessed                                        Exercises Total Joint Exercises Ankle Circles/Pumps: AROM;Both;10 reps;Supine Quad Sets: AROM;Right;5 reps;Supine Towel Squeeze: AROM;Both;10 reps Short Arc QuadBarbaraann Boys: AAROM;Right;10 reps;Supine Heel Slides: AAROM;Right;10 reps;Supine Hip ABduction/ADduction: AAROM;Right;10 reps;Supine Straight Leg Raises: AAROM;AROM;Right;10 reps;Supine Goniometric ROM: 5-60* AAROM R KNee    General Comments        Pertinent Vitals/Pain Pain Score: 7  Pain Location: R knee with exercises and walking Pain Descriptors / Indicators: Sore;Sharp Pain Intervention(s): Limited activity within patient's tolerance;Monitored during session;Premedicated before session;Ice applied    Home Living                      Prior Function            PT Goals (current goals can now be found in the care plan section) Acute Rehab PT Goals Patient Stated 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 : Unable Difficulty sitting down on and standing up from a chair with arms (e.g., wheelchair, bedside commode, etc,.)?: Unable Help needed moving to and from a bed to chair (including a wheelchair)?: A Little Help needed walking  in hospital room?: A Little Help needed climbing 3-5 steps with a railing? : A Lot 6 Click Score: 12    End of Session Equipment Utilized During Treatment: Gait belt Activity Tolerance: Patient tolerated treatment well Patient left: with call bell/phone within reach;with family/visitor present;in chair Nurse Communication: Mobility status PT Visit Diagnosis: Difficulty in walking, not elsewhere classified (R26.2);Muscle weakness (generalized) (M62.81);Pain Pain - Right/Left: Right Pain - part of body: Knee     Time: 5638-7564 PT Time Calculation (min) (ACUTE ONLY): 44 min  Charges:  $Gait Training: 8-22 mins $Therapeutic Exercise: 23-37 mins                    G Codes:          Tamala Ser 10/24/2017, 9:28 AM [phone]

## 2017-10-24 NOTE — Evaluation (Signed)
Occupational Therapy Evaluation Patient Details Name: Tammy [name] MRN: [id number] DOB: Nov 12, 1955 Today's Date: 10/24/2017    History of Present Illness R TKA   Clinical Impression   This 62 year old female was admitted for the above sx.  OT consulted for tub bench education. Completed this with alternatives given.  No further OT needs were identified.  Husband and sister who will be helping her were present    Follow Up Recommendations  No OT follow up    Equipment Recommendations  3 in 1 bedside commode;Tub/shower bench    Recommendations for Other Services       Precautions / Restrictions Precautions Precautions: Knee Precaution Booklet Issued: Yes (comment) Precaution Comments: instructed pt in no pillow under knee Restrictions Weight Bearing Restrictions: No Other Position/Activity Restrictions: WBAT      Mobility Bed Mobility               General bed mobility comments: oob  Transfers Overall transfer level: Needs assistance Equipment used: Rolling walker (2 wheeled) Transfers: Sit to/from Stand Sit to Stand: Min guard         General transfer comment: VCs hand placement    Balance Overall balance assessment: Modified Independent                                         ADL either performed or assessed with clinical judgement   ADL Overall ADL's : Needs assistance/impaired                                       General ADL Comments: sister and husband present. Sister will assist with adls and she feels comfortable. She has helped another family member in the past.  OT was consulted about tub transfer.  Pt has a challenging bathroom layout.  brought tub bench and simulated/reviewed alternative positions to safely access this.They believe tub bench will work.  The second tub is a high jacuzzi tub, so tub bench will not work for this.  Pt and family verbalize understanding of use of tub bench and potentially  turning it backwards to access it.  Pt did not want to practice transfer at this time     Vision         Perception     Praxis      Pertinent Vitals/Pain Pain Assessment: Faces Pain Score: 7  Faces Pain Scale: Hurts a little bit Pain Location: R knee sitting Pain Descriptors / Indicators: Sore;Sharp Pain Intervention(s): Limited activity within patient's tolerance;Monitored during session     Hand Dominance     Extremity/Trunk Assessment Upper Extremity Assessment Upper Extremity Assessment: Overall WFL for tasks assessed           Communication Communication Communication: No difficulties   Cognition Arousal/Alertness: Awake/alert Behavior During Therapy: WFL for tasks assessed/performed Overall Cognitive Status: Within Functional Limits for tasks assessed                                     General Comments       Exercises    Shoulder Instructions      Home Living Family/patient expects to be discharged to:: Private residence Living Arrangements: Spouse/significant other;Other relatives Available Help at Discharge: Available  24 hours/day;Family               Bathroom Shower/Tub: Chief Strategy OfficerTub/shower unit   Bathroom Toilet: Handicapped height(also one standard)     Home Equipment: Walker - 4 wheels;Cane - single point   Additional Comments: ordered 3:1 and tub bench      Prior Functioning/Environment Level of Independence: Independent                 OT Problem List:        OT Treatment/Interventions:      OT Goals(Current goals can be found in the care plan section) Acute Rehab OT Goals Patient Stated Goal: 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, be able to go up stairs easily OT Goal Formulation: All assessment and education complete, DC therapy  OT Frequency:     Barriers to D/C:            Co-evaluation              AM-PAC PT "6 Clicks" Daily Activity     Outcome Measure Help from another person eating  meals?: None Help from another person taking care of personal grooming?: A Little Help from another person toileting, which includes using toliet, bedpan, or urinal?: A Little Help from another person bathing (including washing, rinsing, drying)?: A Lot Help from another person to put on and taking off regular upper body clothing?: A Little Help from another person to put on and taking off regular lower body clothing?: A Lot 6 Click Score: 17   End of Session    Activity Tolerance: Patient tolerated treatment well Patient left: in chair;with call bell/phone within reach;with family/visitor present  OT Visit Diagnosis: Pain Pain - Right/Left: Right Pain - part of body: Knee                Time: 1610-96041048-1125 OT Time Calculation (min): 37 min Charges:  OT General Charges $OT Visit: 1 Visit OT Evaluation $OT Eval Low Complexity: 1 Low G-Codes:     Crescent CityMaryellen Elier [name], OTR/L [phone] 10/24/2017  Tammy [name] 10/24/2017, 11:38 AM

## 2017-10-25 ENCOUNTER — Encounter (HOSPITAL_COMMUNITY): Payer: Self-pay | Admitting: Specialist

## 2017-10-25 LAB — CBC
HCT: 32.2 % — ABNORMAL LOW (ref 36.0–46.0)
HEMOGLOBIN: 10.6 g/dL — AB (ref 12.0–15.0)
MCH: 30.7 pg (ref 26.0–34.0)
MCHC: 32.9 g/dL (ref 30.0–36.0)
MCV: 93.3 fL (ref 78.0–100.0)
PLATELETS: 169 10*3/uL (ref 150–400)
RBC: 3.45 MIL/uL — AB (ref 3.87–5.11)
RDW: 13.7 % (ref 11.5–15.5)
WBC: 9 10*3/uL (ref 4.0–10.5)

## 2017-10-25 NOTE — Progress Notes (Addendum)
Physical Therapy Treatment Patient Details Name: Tammy [name] MRN: [id number] DOB: [birth year] Today's Date: 10/25/2017    History of Present Illness R TKA    PT Comments    POD #3 assisted patient with sit to stand, gait, stairs, and therex. Patient with help of sister able to to do 2 staris up/dn backwards with RW; minimal VC's needed. Patient used step-though antalgic gait pattern with good sequencing. Patient's sister supervised therex with some cueing needed to prevent compensatory exerternal hip rotation. Patient c/o of an increase in pain (8/10) after PT.    Follow Up Recommendations  Follow surgeon's recommendation for DC plan and follow-up therapies     Equipment Recommendations  Rolling walker with 5" wheels;3in1 (PT)    Recommendations for Other Services       Precautions / Restrictions Precautions Precautions: Knee Precaution Booklet Issued: Yes (comment) Precaution Comments: instructed pt in no pillow under knee Restrictions Weight Bearing Restrictions: No Other Position/Activity Restrictions: WBAT    Mobility  Bed Mobility                  Transfers Overall transfer level: Needs assistance Equipment used: Rolling walker (2 wheeled) Transfers: Sit to/from Stand Sit to Stand: Supervision            Ambulation/Gait Ambulation/Gait assistance: Min guard   Assistive device: Rolling walker (2 wheeled) Gait Pattern/deviations: Step-through pattern;Decreased stride length;Antalgic         Stairs   Stairs assistance: Min assist Stair Management: One rail Right;Backwards;Forwards;With walker       Wheelchair Mobility    Modified Rankin (Stroke Patients Only)       Balance                                            Cognition Arousal/Alertness: Awake/alert Behavior During Therapy: WFL for tasks assessed/performed Overall Cognitive Status: Within Functional Limits for tasks assessed                                         Exercises Total Joint Exercises Ankle Circles/Pumps: AROM;Both;Seated;5 reps Quad Sets: AROM;Right;5 reps;Seated Towel Squeeze: AROM;Right;5 reps;Seated Short Arc Quad: AROM;Right;5 reps;Seated Heel Slides: AAROM;Right;Supine;5 reps Hip ABduction/ADduction: AAROM;Right;Supine;5 reps Straight Leg Raises: AAROM;Right;Supine;5 reps Long Arc Quad: AROM;Right;Seated;5 reps    General Comments        Pertinent Vitals/Pain Pain Assessment: 0-10 Pain Score: 8  Pain Location: R knee with movement Pain Descriptors / Indicators: Sore;Sharp;Aching Pain Intervention(s): Limited activity within patient's tolerance;Repositioned;Ice applied;Monitored during session    Home Living                      Prior Function            PT Goals (current goals can now be found in the care plan section) Progress towards PT goals: Progressing toward goals    Frequency    7X/week      PT Plan Current plan remains appropriate    Co-evaluation              AM-PAC PT "6 Clicks" Daily Activity  Outcome Measure  Difficulty turning over in bed (including adjusting bedclothes, sheets and blankets)?: A Lot Difficulty moving from lying on back to sitting on the side of the bed? :  Unable Difficulty sitting down on and standing up from a chair with arms (e.g., wheelchair, bedside commode, etc,.)?: A Lot Help needed moving to and from a bed to chair (including a wheelchair)?: A Little Help needed walking in hospital room?: A Little Help needed climbing 3-5 steps with a railing? : A Lot 6 Click Score: 13    End of Session Equipment Utilized During Treatment: Gait belt Activity Tolerance: Patient tolerated treatment well Patient left: with call bell/phone within reach;with family/visitor present;in chair Nurse Communication: Mobility status PT Visit Diagnosis: Difficulty in walking, not elsewhere classified (R26.2);Muscle weakness (generalized)  (M62.81);Pain Pain - Right/Left: Right Pain - part of body: Knee     Time: 1610-96041032-1113 PT Time Calculation (min) (ACUTE ONLY): 41 min  Charges:  $Gait Training: 8-22 mins $Therapeutic Exercise: 8-22 mins $Therapeutic Activity: 8-22 mins                    G Codes:      Tammy [name], SPTA 10/25/2017, 1:22 PM  Direct supervision and agree with above  Felecia ShellingLori Daneka Lantigua  PTA [facility]  Acute  Rehab Pager      [phone]

## 2017-10-25 NOTE — Care Management Note (Signed)
Case Management Note  Patient Details  Name: Tammy [name] MRN: [id number] Date of Birth: 1955-05-30  Subjective/Objective: Spoke to Tammy [name] Worker's Comp [phone]-they have already set patient up w/HHC agency, & have already delivered home dme to patient's home prior to admission. I have faxed d/c summary to fax#[phone] for claim#[id number]. No further CM needs. Nurse updated.                   Action/Plan:d/c home w/HHC.   Expected Discharge Date:  10/25/17               Expected Discharge Plan:  Home w Home Health Services  In-House Referral:  Clinical Social Work  Discharge planning Services  CM Consult  Post Acute Care Choice:  Home Health Choice offered to:     DME Arranged:  3-N-1, Tub bench DME Agency:  [facility]  HH Arranged:  PT HH Agency:  ([facility]'s [facility]  Status of Service:  Completed, signed off  If discussed at [employer] Meetings, dates discussed:    Additional Comments:  Tammy [name], Tammy Sowell, RN 10/25/2017, 11:32 AM

## 2017-10-25 NOTE — Progress Notes (Signed)
Subjective: 3 Days Post-Op Procedure(s) (LRB): RIGHT TOTAL KNEE ARTHROPLASTY (Right) Patient reports pain as well controlled.  Progress with PT. Positive flatus. Denies SOb or CP.  Objective: Vital signs in last 24 hours: Temp:  [98.6 F (37 C)-99.4 F (37.4 C)] 98.6 F (37 C) (07/08 0542) Pulse Rate:  [80-85] 85 (07/08 0542) Resp:  [14-16] 16 (07/08 0542) BP: (138-154)/(55-67) 154/55 (07/08 0542) SpO2:  [97 %-100 %] 97 % (07/08 0542)  Intake/Output from previous day: 07/07 0701 - 07/08 0700 In: 1282.5 [P.O.:960; I.V.:322.5] Out: -  Intake/Output this shift: No intake/output data recorded.  Recent Labs    10/23/17 0514 10/24/17 0445 10/25/17 0522  HGB 12.1 10.9* 10.6*   Recent Labs    10/24/17 0445 10/25/17 0522  WBC 9.2 9.0  RBC 3.53* 3.45*  HCT 33.0* 32.2*  PLT 162 169   Recent Labs    10/23/17 0514  NA 142  K 4.2  CL 109  CO2 25  BUN 10  CREATININE 0.59  GLUCOSE 143*  CALCIUM 8.9   No results for input(s): LABPT, INR in the last 72 hours.  Well nourished. Alert and oriented x3. RRR, Lungs clear, BS x4. Abdomen soft and non tender. Right Calf soft and non tender. Right knee dressing C/D/I. No DVT signs. Compartment soft. No signs of infection.  Right LE grossly neurovascular intact.  Anticipated LOS equal to or greater than 2 midnights due to - Age 63 and older with one or more of the following:  - Obesity  - Expected need for hospital services (PT, OT, Nursing) required for safe  discharge  - Anticipated need for postoperative skilled nursing care or inpatient rehab  - Active co-morbidities: None OR   - Unanticipated findings during/Post Surgery: None  - Patient is a high risk of re-admission due to: None   Assessment/Plan: 3 Days Post-Op Procedure(s) (LRB): RIGHT TOTAL KNEE ARTHROPLASTY (Right) Advance diet Up with therapy Discharge home with home health  Face  To face for HHPT Follow instructions ASA BID F/u in 2 weeks    Zahirah Cheslock,  Gearald Stonebraker L 10/25/2017, 7:54 AM

## 2017-10-25 NOTE — Progress Notes (Signed)
PHYSICAL THERAPY  Orthostatic hypotension episode.  Unable to amb past 5 feet.  MAX dizzy/near syncope.  Vitals in Epic.  RN called to room  Felecia ShellingLori Jannel Lynne  PTA [facility]  Acute  Rehab Pager      [phone]

## 2018-02-09 ENCOUNTER — Other Ambulatory Visit: Payer: Self-pay | Admitting: Family Medicine

## 2018-02-09 ENCOUNTER — Other Ambulatory Visit (HOSPITAL_COMMUNITY)
Admission: RE | Admit: 2018-02-09 | Discharge: 2018-02-09 | Disposition: A | Discharge status: Home / Self Care | Payer: BLUE CROSS/BLUE SHIELD | Source: Ambulatory Visit | Attending: Family Medicine | Admitting: Family Medicine

## 2018-02-09 DIAGNOSIS — Z01411 Encounter for gynecological examination (general) (routine) with abnormal findings: Secondary | ICD-10-CM | POA: Insufficient documentation

## 2018-02-14 LAB — CYTOLOGY - PAP: Diagnosis: NEGATIVE

## 2018-02-24 ENCOUNTER — Other Ambulatory Visit: Payer: Self-pay | Admitting: Family Medicine

## 2018-02-24 DIAGNOSIS — Z1231 Encounter for screening mammogram for malignant neoplasm of breast: Secondary | ICD-10-CM

## 2018-04-07 ENCOUNTER — Ambulatory Visit
Admission: RE | Admit: 2018-04-07 | Discharge: 2018-04-07 | Disposition: A | Discharge status: Home / Self Care | Payer: BLUE CROSS/BLUE SHIELD | Source: Ambulatory Visit | Attending: Family Medicine | Admitting: Family Medicine

## 2018-04-07 DIAGNOSIS — Z1231 Encounter for screening mammogram for malignant neoplasm of breast: Secondary | ICD-10-CM

## 2019-02-28 ENCOUNTER — Other Ambulatory Visit: Payer: Self-pay | Admitting: Cardiology

## 2019-02-28 DIAGNOSIS — Z20822 Contact with and (suspected) exposure to covid-19: Secondary | ICD-10-CM

## 2019-03-02 LAB — NOVEL CORONAVIRUS, NAA: SARS-CoV-2, NAA: NOT DETECTED

## 2019-03-06 ENCOUNTER — Telehealth: Payer: Self-pay

## 2019-03-06 NOTE — Telephone Encounter (Signed)
Received call from patient checking Covid results.  Advised results negative.   

## 2019-03-31 ENCOUNTER — Other Ambulatory Visit: Payer: Self-pay

## 2019-03-31 DIAGNOSIS — Z20822 Contact with and (suspected) exposure to covid-19: Secondary | ICD-10-CM

## 2019-04-02 LAB — NOVEL CORONAVIRUS, NAA: SARS-CoV-2, NAA: NOT DETECTED

## 2019-10-27 ENCOUNTER — Encounter: Payer: Self-pay | Admitting: Gastroenterology

## 2019-12-27 ENCOUNTER — Other Ambulatory Visit (INDEPENDENT_AMBULATORY_CARE_PROVIDER_SITE_OTHER): Payer: PPO

## 2019-12-27 ENCOUNTER — Ambulatory Visit: Payer: PPO | Admitting: Gastroenterology

## 2019-12-27 ENCOUNTER — Encounter: Payer: Self-pay | Admitting: Gastroenterology

## 2019-12-27 VITALS — BP 120/76 | HR 68 | Ht 64.5 in | Wt 257.8 lb

## 2019-12-27 DIAGNOSIS — R159 Full incontinence of feces: Secondary | ICD-10-CM

## 2019-12-27 DIAGNOSIS — K219 Gastro-esophageal reflux disease without esophagitis: Secondary | ICD-10-CM | POA: Diagnosis not present

## 2019-12-27 DIAGNOSIS — K529 Noninfective gastroenteritis and colitis, unspecified: Secondary | ICD-10-CM

## 2019-12-27 LAB — COMPREHENSIVE METABOLIC PANEL
ALT: 19 U/L (ref 0–35)
AST: 24 U/L (ref 0–37)
Albumin: 4.3 g/dL (ref 3.5–5.2)
Alkaline Phosphatase: 73 U/L (ref 39–117)
BUN: 10 mg/dL (ref 6–23)
CO2: 29 mEq/L (ref 19–32)
Calcium: 9.2 mg/dL (ref 8.4–10.5)
Chloride: 104 mEq/L (ref 96–112)
Creatinine, Ser: 0.6 mg/dL (ref 0.40–1.20)
GFR: 100.29 mL/min (ref >60.00)
Glucose, Bld: 89 mg/dL (ref 70–99)
Potassium: 3.8 mEq/L (ref 3.5–5.1)
Sodium: 140 mEq/L (ref 135–145)
Total Bilirubin: 0.8 mg/dL (ref 0.2–1.2)
Total Protein: 7.3 g/dL (ref 6.0–8.3)

## 2019-12-27 LAB — CBC WITH DIFFERENTIAL/PLATELET
Basophils Absolute: 0.1 K/uL (ref 0.0–0.1)
Basophils Relative: 1 % (ref 0.0–3.0)
Eosinophils Absolute: 0.4 K/uL (ref 0.0–0.7)
Eosinophils Relative: 8.1 % — ABNORMAL HIGH (ref 0.0–5.0)
HCT: 38.5 % (ref 36.0–46.0)
Hemoglobin: 12.8 g/dL (ref 12.0–15.0)
Lymphocytes Relative: 40.1 % (ref 12.0–46.0)
Lymphs Abs: 2 K/uL (ref 0.7–4.0)
MCHC: 33.2 g/dL (ref 30.0–36.0)
MCV: 90.6 fl (ref 78.0–100.0)
Monocytes Absolute: 0.7 K/uL (ref 0.1–1.0)
Monocytes Relative: 14 % — ABNORMAL HIGH (ref 3.0–12.0)
Neutro Abs: 1.9 K/uL (ref 1.4–7.7)
Neutrophils Relative %: 36.8 % — ABNORMAL LOW (ref 43.0–77.0)
Platelets: 205 K/uL (ref 150.0–400.0)
RBC: 4.25 Mil/uL (ref 3.87–5.11)
RDW: 13.6 % (ref 11.5–15.5)
WBC: 5.1 K/uL (ref 4.0–10.5)

## 2019-12-27 LAB — IGA: IgA: 272 mg/dL (ref 68–378)

## 2019-12-27 LAB — TSH: TSH: 1.87 u[IU]/mL (ref 0.35–4.50)

## 2019-12-27 MED ORDER — SUTAB 1479-225-188 MG PO TABS: 1.0000 | ORAL_TABLET | Freq: Once | ORAL | 0 refills | Status: AC

## 2019-12-27 NOTE — Patient Instructions (Signed)
If you are age 65 or older, your body mass index should be between 23-30. Your Body mass index is 43.57 kg/m. If this is out of the aforementioned range listed, please consider follow up with your Primary Care Provider.  If you are age 15 or younger, your body mass index should be between 19-25. Your Body mass index is 43.57 kg/m. If this is out of the aformentioned range listed, please consider follow up with your Primary Care Provider.   You have been scheduled for a colonoscopy. Please follow written instructions given to you at your visit today.  Please pick up your prep supplies at the pharmacy within the next 1-3 days. If you use inhalers (even only as needed), please bring them with you on the day of your procedure.  Please go to the lab in the basement of our building to have lab work done as you leave today. Hit "B" for basement when you get on the elevator.  When the doors open the lab is on your left.  We will call you with the results. Thank you.  Due to recent changes in healthcare laws, you may see the results of your imaging and laboratory studies on MyChart before your provider has had a chance to review them.  We understand that in some cases there may be results that are confusing or concerning to you. Not all laboratory results come back in the same time frame and the provider may be waiting for multiple results in order to interpret others.  Please give [place] 48 hours in order for your provider to thoroughly review all the results before contacting the office for clarification of your results.   Thank you for entrusting me with your care and for choosing [facility], Dr. Ileene Patrick

## 2019-12-27 NOTE — Progress Notes (Signed)
HPI :  64 year old female with a history of GERD, dysphagia, allergic rhinitis, referred by Shaune Pollack, MD for chronic diarrhea, fecal incontinence.  She states her bowels were for the most part normal and regular up until a preserve 2+ years ago.  She states following her knee replacement she has had issues with persistent diarrhea.  She will have roughly 3 bowel movements per day, loose, if she does not take anything to manage it.  She has significant urgency with her bowels, this is led to multiple episodes of fecal incontinence.  She does not leave her house without a change of clothes at this point.  She has been using Imodium AD, liquid form, usually 2 doses a day to keep things at bay.  This does help provide some normalcy to her symptoms, although sometimes it is not enough and she will have breakthrough symptoms, or otherwise can rarely become constipated, and when that happens she has occasional bleeding from her hemorrhoids.  She denies any blood in her stool if she is not constipated.  Has not had any nocturnal symptoms of bother.  No abdominal pains that bother her.  She denies any major changes in her medications since the symptoms started.  She does have some postprandial component, fried or greasy foods can often produce symptoms, however salads can do this as well.  She does not notice any other specific food triggers.  She denies any NSAID use.  Denies any magnesium supplements or herbals.  She does take omeprazole 20 mg a day for chronic reflux symptoms.  This works really well for her.  She has had a history of dysphagia remotely for benign stricture, has been dilated in the past, states she has not needed this in a long time.  She denies any dysphagia at this time.  States the omeprazole works really well for her that she has been on chronic PPI for years.  Her last EGD was in 2015 she had no evidence of Barrett's esophagus.  The exam was normal at that time.    She denies any family  history of colon cancer, no IBD, no celiac disease.  She does not smoke any tobacco.  Denies any cardiopulmonary symptoms.  Her last colonoscopy was in 2015, good prep, no polyps, hemorrhoids noted. She has not had labs done since 2019.   Colonoscopy 02/06/2014 - good prep, no polyps, internal hemorrhoids -   EGD 01/29/14 - normal - exam done for bleeding   Past Medical History:  Diagnosis Date  . Allergic rhinitis, cause unspecified   . Diaphragmatic hernia without mention of obstruction or gangrene   . GERD (gastroesophageal reflux disease)   . Other and unspecified ovarian cyst   . Other disorder of menstruation and other abnormal bleeding from female genital tract   . Plantar fascial fibromatosis   . Schatzki's ring   . Stricture and stenosis of esophagus      Past Surgical History:  Procedure Laterality Date  . BACK SURGERY     lumbar  . ESOPHAGOGASTRODUODENOSCOPY (EGD) WITH PROPOFOL N/A 01/29/2014   Procedure: ESOPHAGOGASTRODUODENOSCOPY (EGD) WITH PROPOFOL;  Surgeon: Louis Meckel, MD;  Location: [facility];  Service: Endoscopy;  Laterality: N/A;  . KNEE SURGERY Right 02/2012   trorn meniscus repair  . TONSILLECTOMY    . TOTAL KNEE ARTHROPLASTY Right 10/22/2017   Procedure: RIGHT TOTAL KNEE ARTHROPLASTY;  Surgeon: Eugenia Mcalpine, MD;  Location: [facility];  Service: Orthopedics;  Laterality: Right;   Family History  Problem Relation Age of Onset  . Hypertension Mother   . Colon cancer Neg Hx    Social History   Tobacco Use  . Smoking status: Never Smoker  . Smokeless tobacco: Never Used  Vaping Use  . Vaping Use: Never used  Substance Use Topics  . Alcohol use: No  . Drug use: No   Current Outpatient Medications  Medication Sig Dispense Refill  . acetaminophen (TYLENOL) 500 MG tablet Take 500 mg by mouth 2 (two) times daily as needed for mild pain or headache.     . Ascorbic Acid (VITAMIN C) 1000 MG tablet Take 1,000 mg by mouth daily.    . cholecalciferol  (VITAMIN D) 1000 units tablet Take 1,000 Units by mouth daily.    [name] loperamide (IMODIUM A-D) 2 MG tablet Take 2 mg by mouth 4 (four) times daily as needed for diarrhea or loose stools.    [name] loratadine (CLARITIN) 10 MG tablet Take 10 mg by mouth every morning.     . Multiple Vitamins-Minerals (MULTIVITAMIN PO) Take 1 tablet by mouth every morning. Women's Centrum Silver    . omeprazole (PRILOSEC) 20 MG capsule Take 20 mg by mouth daily.    . sodium bicarbonate 650 MG tablet Take 650-1,300 mg by mouth See admin instructions. Pt alternates 1 tablet every other day with 2 tablets every other day     No current facility-administered medications for this visit.   Allergies  Allergen Reactions  . Propoxyphene Hcl     hallucinations  . Amoxicillin Rash  . Carisprodol [Carisoprodol] Itching and Rash  . Doxycycline Itching and Rash  . Penicillins Rash    Has patient had a PCN reaction causing immediate rash, facial/tongue/throat swelling, SOB or lightheadedness with hypotension: yes Has patient had a PCN reaction causing severe rash involving mucus membranes or skin necrosis: no Has patient had a PCN reaction that required hospitalization: no Has patient had a PCN reaction occurring within the last 10 years: yes If all of the above answers are "NO", then may proceed with Cephalosporin use.      Review of Systems: All systems reviewed and negative except where noted in HPI.   Lab Results  Component Value Date   WBC 9.0 10/25/2017   HGB 10.6 (L) 10/25/2017   HCT 32.2 (L) 10/25/2017   MCV 93.3 10/25/2017   PLT 169 10/25/2017    Lab Results  Component Value Date   CREATININE 0.59 10/23/2017   BUN 10 10/23/2017   NA 142 10/23/2017   K 4.2 10/23/2017   CL 109 10/23/2017   CO2 25 10/23/2017    No results found for: ALT, AST, GGT, ALKPHOS, BILITOT   Physical Exam: BP 120/76   Pulse 68   Ht 5' 4.5" (1.638 m)   Wt 257 lb 12.8 oz (116.9 kg)   BMI 43.57 kg/m  Constitutional:  Pleasant,well-developed, female in no acute distress. HEENT: Normocephalic and atraumatic. Conjunctivae are normal. No scleral icterus. Neck supple.  Cardiovascular: Normal rate, regular rhythm.  Pulmonary/chest: Effort normal and breath sounds normal.  Abdominal: Soft, nondistended, nontender. . There are no masses palpable.  Extremities: no edema Lymphadenopathy: No cervical adenopathy noted. Neurological: Alert and oriented to person place and time. Skin: Skin is warm and dry. No rashes noted. Psychiatric: Normal mood and affect. Behavior is normal.   ASSESSMENT AND PLAN: 64 year old female here for reassessment of the following:  Chronic diarrhea / fecal incontinence - as above, 2 years worth of persistent loose stools, often  with significant urgency and has led to fecal incontinence.  Managing this with Imodium OTC which can help however can have breakthrough symptoms despite this, and also can induce constipation at times.  No clear triggers for this otherwise, no new medication changes around this time.  I discussed differential diagnosis with her.  We will plan on doing some routine labs today to ensure okay, we will also screen her TSH and for celiac disease to ensure normal.  Ultimately given her fecal incontinence and urgency, I am recommending a colonoscopy to exclude IBD/microscopic colitis which could present like this.  Following discussion of risks and benefits of colonoscopy and anesthesia, she want to proceed, she will need to hold Imodium a few days before the exam so she can prep adequately.  She agreed with the plan, further recommendations pending the results.  If she does end up having microscopic colitis, may need to stop her PPI.,  Will await results.  GERD - longstanding symptoms that have required use of PPI to maintain control.  She is on low-dose omeprazole 20 mg a day and doing pretty well with this without any recurrence of symptoms.  She had a history of ulcers and  benign stricturing of her esophagus.  No reported history of Barrett's.  She wishes to continue therapy for now, as it is working quite well for her, benefits likely outweigh risks.  Will ensure renal function stable.  Ileene Patrick, MD [facility]  CC: Shaune Pollack MD

## 2019-12-28 LAB — TISSUE TRANSGLUTAMINASE, IGA: (tTG) Ab, IgA: 1 U/mL

## 2020-01-19 ENCOUNTER — Encounter: Payer: Self-pay | Admitting: Gastroenterology

## 2020-01-19 ENCOUNTER — Other Ambulatory Visit: Payer: Self-pay

## 2020-01-19 ENCOUNTER — Ambulatory Visit (AMBULATORY_SURGERY_CENTER): Payer: PPO | Admitting: Gastroenterology

## 2020-01-19 VITALS — BP 118/66 | HR 73 | Temp 97.8°F | Resp 15 | Ht 64.5 in | Wt 257.0 lb

## 2020-01-19 DIAGNOSIS — K648 Other hemorrhoids: Secondary | ICD-10-CM | POA: Diagnosis not present

## 2020-01-19 DIAGNOSIS — R159 Full incontinence of feces: Secondary | ICD-10-CM

## 2020-01-19 DIAGNOSIS — K52832 Lymphocytic colitis: Secondary | ICD-10-CM | POA: Diagnosis not present

## 2020-01-19 DIAGNOSIS — K529 Noninfective gastroenteritis and colitis, unspecified: Secondary | ICD-10-CM

## 2020-01-19 MED ORDER — SODIUM CHLORIDE 0.9 % IV SOLN: 500.0000 mL | Freq: Once | INTRAVENOUS | Status: DC

## 2020-01-19 NOTE — Progress Notes (Signed)
pt tolerated well. VSS. awake and to recovery. Report given to RN.  

## 2020-01-19 NOTE — Patient Instructions (Signed)
YOU HAD AN ENDOSCOPIC PROCEDURE TODAY AT THE [facility]:   Refer to the procedure report that was given to you for any specific questions about what was found during the examination.  If the procedure report does not answer your questions, please call your gastroenterologist to clarify.  If you requested that your care partner not be given the details of your procedure findings, then the procedure report has been included in a sealed envelope for you to review at your convenience later.  YOU SHOULD EXPECT: Some feelings of bloating in the abdomen. Passage of more gas than usual.  Walking can help get rid of the air that was put into your GI tract during the procedure and reduce the bloating. If you had a lower endoscopy (such as a colonoscopy or flexible sigmoidoscopy) you may notice spotting of blood in your stool or on the toilet paper. If you underwent a bowel prep for your procedure, you may not have a normal bowel movement for a few days.  Please Note:  You might notice some irritation and congestion in your nose or some drainage.  This is from the oxygen used during your procedure.  There is no need for concern and it should clear up in a day or so.  SYMPTOMS TO REPORT IMMEDIATELY:   Following lower endoscopy (colonoscopy or flexible sigmoidoscopy):  Excessive amounts of blood in the stool  Significant tenderness or worsening of abdominal pains  Swelling of the abdomen that is new, acute  Fever of 100F or higher  For urgent or emergent issues, a gastroenterologist can be reached at any hour by calling [phone]. Do not use MyChart messaging for urgent concerns.    DIET:  We do recommend a small meal at first, but then you may proceed to your regular diet.  Drink plenty of fluids but you should avoid alcoholic beverages for 24 hours.  ACTIVITY:  You should plan to take it easy for the rest of today and you should NOT DRIVE or use heavy machinery until tomorrow (because  of the sedation medicines used during the test).    FOLLOW UP: Our staff will call the number listed on your records 48-72 hours following your procedure to check on you and address any questions or concerns that you may have regarding the information given to you following your procedure. If we do not reach you, we will leave a message.  We will attempt to reach you two times.  During this call, we will ask if you have developed any symptoms of COVID 19. If you develop any symptoms (ie: fever, flu-like symptoms, shortness of breath, cough etc.) before then, please call [phone].  If you test positive for Covid 19 in the 2 weeks post procedure, please call and report this information to us.    If any biopsies were taken you will be contacted by phone or by letter within the next 1-3 weeks.  Please call us at [phone] if you have not heard about the biopsies in 3 weeks.    SIGNATURES/CONFIDENTIALITY: You and/or your care partner have signed paperwork which will be entered into your electronic medical record.  These signatures attest to the fact that that the information above on your After Visit Summary has been reviewed and is understood.  Full responsibility of the confidentiality of this discharge information lies with you and/or your care-partner. 

## 2020-01-19 NOTE — Op Note (Signed)
[facility] Patient Name: Tammy [name] Procedure Date: 01/19/2020 4:29 PM MRN: [id number] Endoscopist: Tammy [name] , MD Age: 64 Referring MD:  Date of Birth: March 11, 1956 Gender: Female Account #: [id number] Procedure:                Colonoscopy Indications:              Chronic diarrhea, Fecal incontinence Medicines:                Monitored Anesthesia Care Procedure:                Pre-Anesthesia Assessment:                           - Prior to the procedure, a History and Physical                            was performed, and patient medications and                            allergies were reviewed. The patient's tolerance of                            previous anesthesia was also reviewed. The risks                            and benefits of the procedure and the sedation                            options and risks were discussed with the patient.                            All questions were answered, and informed consent                            was obtained. Prior Anticoagulants: The patient has                            taken no previous anticoagulant or antiplatelet                            agents. ASA Grade Assessment: III - A patient with                            severe systemic disease. After reviewing the risks                            and benefits, the patient was deemed in                            satisfactory condition to undergo the procedure.                           After obtaining informed consent, the colonoscope  was passed under direct vision. Throughout the                            procedure, the patient's blood pressure, pulse, and                            oxygen saturations were monitored continuously. The                            Colonoscope was introduced through the anus and                            advanced to the the terminal ileum, with                            identification of the  appendiceal orifice and IC                            valve. The colonoscopy was performed without                            difficulty. The patient tolerated the procedure                            well. The quality of the bowel preparation was                            good. The terminal ileum, ileocecal valve,                            appendiceal orifice, and rectum were photographed. Scope In: 4:43:36 PM Scope Out: 5:05:41 PM Scope Withdrawal Time: 0 hours 19 minutes 9 seconds  Total Procedure Duration: 0 hours 22 minutes 5 seconds  Findings:                 The perianal and digital rectal examinations were                            normal.                           The terminal ileum appeared normal.                           Internal hemorrhoids were found during retroflexion.                           The exam was otherwise without abnormality. No                            polyps. No overt inflammatory changes.                           Biopsies for histology were taken with a cold  forceps from the right colon, left colon and                            transverse colon for evaluation of microscopic                            colitis. Complications:            No immediate complications. Estimated blood loss:                            Minimal. Estimated Blood Loss:     Estimated blood loss was minimal. Impression:               - The examined portion of the ileum was normal.                           - Internal hemorrhoids.                           - The examination was otherwise normal.                           - Biopsies were taken with a cold forceps from the                            right colon, left colon and transverse colon for                            evaluation of microscopic colitis. Recommendation:           - Patient has a contact number available for                            emergencies. The signs and symptoms of potential                             delayed complications were discussed with the                            patient. Return to normal activities tomorrow.                            Written discharge instructions were provided to the                            patient.                           - Resume previous diet.                           - Continue present medications.                           - Await pathology results, with further  recommendations pending the results. Tammy Spare P. Quavion Boule, MD 01/19/2020 5:09:12 PM This report has been signed electronically.

## 2020-01-19 NOTE — Progress Notes (Signed)
Called to room to assist during endoscopic procedure.  Patient ID and intended procedure confirmed with present staff. Received instructions for my participation in the procedure from the performing physician.  

## 2020-01-23 ENCOUNTER — Telehealth: Payer: Self-pay

## 2020-01-23 NOTE — Telephone Encounter (Signed)
  Follow up Call-  Call back number 01/19/2020  Post procedure Call Back phone  # [phone]  Permission to leave phone message Yes  Some recent data might be hidden     Patient questions:  Do you have a fever, pain , or abdominal swelling? No. Pain Score  0 *  Have you tolerated food without any problems? Yes.    Have you been able to return to your normal activities? Yes.    Do you have any questions about your discharge instructions: Diet   No. Medications  No. Follow up visit  No.  Do you have questions or concerns about your Care? No.  Actions: * If pain score is 4 or above: No action needed, pain <4.  1. Have you developed a fever since your procedure? no  2.   Have you had an respiratory symptoms (SOB or cough) since your procedure? no  3.   Have you tested positive for COVID 19 since your procedure no  4.   Have you had any family members/close contacts diagnosed with the COVID 19 since your procedure?  no   If yes to any of these questions please route to Laverna Peace, RN and Karlton Lemon, RN

## 2020-01-23 NOTE — Telephone Encounter (Deleted)
  Follow up Call-  Call back number 01/19/2020  Post procedure Call Back phone  # [phone]  Permission to leave phone message Yes  Some recent data might be hidden     Patient questions:  Do you have a fever, pain , or abdominal swelling? No. Pain Score  0 *  Have you tolerated food without any problems? Yes.    Have you been able to return to your normal activities? Yes.    Do you have any questions about your discharge instructions: Diet   No. Medications  No. Follow up visit  No.  Do you have questions or concerns about your Care? No.  Actions: * If pain score is 4 or above: No action needed, pain <4.  1. Have you developed a fever since your procedure? no  2.   Have you had an respiratory symptoms (SOB or cough) since your procedure? no  3.   Have you tested positive for COVID 19 since your procedure no  4.   Have you had any family members/close contacts diagnosed with the COVID 19 since your procedure?  no   If yes to any of these questions please route to Tracy Walton, RN and Denise Buckner, RN  

## 2020-01-23 NOTE — Telephone Encounter (Signed)
First post procedure follow up call, no answer 

## 2020-01-29 ENCOUNTER — Telehealth: Payer: Self-pay | Admitting: Gastroenterology

## 2020-01-29 ENCOUNTER — Other Ambulatory Visit: Payer: Self-pay

## 2020-01-29 NOTE — Telephone Encounter (Signed)
Spoke with patient, see 01/19/20 pathology result note for more information.

## 2020-01-31 ENCOUNTER — Other Ambulatory Visit: Payer: Self-pay

## 2020-01-31 DIAGNOSIS — K52832 Lymphocytic colitis: Secondary | ICD-10-CM

## 2020-01-31 MED ORDER — BUDESONIDE 3 MG PO CPEP: ORAL_CAPSULE | ORAL | 0 refills | Status: DC

## 2020-02-01 ENCOUNTER — Other Ambulatory Visit: Payer: Self-pay

## 2020-02-01 DIAGNOSIS — K52832 Lymphocytic colitis: Secondary | ICD-10-CM

## 2020-02-01 MED ORDER — BUDESONIDE 3 MG PO CPEP: ORAL_CAPSULE | ORAL | 0 refills | Status: DC

## 2020-02-01 NOTE — Telephone Encounter (Signed)
Patient returned call today regarding budesonide prescription, she is stating that it is over $100. Please advise, thank you

## 2020-02-01 NOTE — Telephone Encounter (Signed)
Spoke with patient, pt is okay with sending RX to new pharmacy. Pt requested that RX be sent to Tammy [name], advised to call if it is still too expensive. Pt is aware that the office is closed tomorrow.  Will call [facility] on Monday to cancel original RX.

## 2020-02-01 NOTE — Telephone Encounter (Signed)
Unfortunately there are not much alternatives within this class. We could try Uceris 9mg  / day for 6 weeks but I think that will likely be much more expensive, she can ask her pharmacits. Not sure if she wishes to try another pharmacy which may have cheaper prices? This is the first line medication for her condition and to treat her symptoms.

## 2020-02-05 NOTE — Telephone Encounter (Signed)
Called [facility] and cancelled prescription for Budesonide as it was too expensive at this location

## 2020-03-28 ENCOUNTER — Ambulatory Visit: Payer: PPO | Admitting: Gastroenterology

## 2020-03-28 ENCOUNTER — Encounter: Payer: Self-pay | Admitting: Gastroenterology

## 2020-03-28 VITALS — BP 118/66 | HR 70 | Ht 64.5 in | Wt 270.0 lb

## 2020-03-28 DIAGNOSIS — K219 Gastro-esophageal reflux disease without esophagitis: Secondary | ICD-10-CM

## 2020-03-28 DIAGNOSIS — K52832 Lymphocytic colitis: Secondary | ICD-10-CM | POA: Diagnosis not present

## 2020-03-28 MED ORDER — FAMOTIDINE 20 MG PO TABS: 20.0000 mg | ORAL_TABLET | Freq: Two times a day (BID) | ORAL | Status: DC

## 2020-03-28 NOTE — Progress Notes (Signed)
HPI :  64 year old female here for follow-up visit for lymphocytic colitis.  I saw her in September for chronic diarrhea with fecal incontinence.  Symptoms have been ongoing for a few years at that point, loose stools with urgency and occasional incontinence.  This was occurring the setting of omeprazole she was taking 20 mg a day for chronic reflux which has worked pretty well for her.  Since her last visit she underwent a colonoscopy with me on October 1.  Her colonoscopy was grossly normal without any obvious inflammation or polyps.  Random biopsies were taken throughout however in revealed lymphocytic colitis.  When that returned I had her stop the omeprazole and switch to Pepcid for treatment of her reflux in case that was related to her lymphocytic colitis.  I treated her with budesonide 9 mg a day for several weeks , followed by a slow taper.  She is currently on her last day of 3 mg of budesonide a day.  In general she is doing much better since have last seen her.  She states her stool frequency is down to roughly 1 bowel movement per day, most the time it is formed.  She previously was using multiple Imodium every day, now she takes it perhaps once per week.  She has had no episodes of fecal incontinence since being treated for this.  She has avoided all NSAIDs.  She had lab work showing normal thyroid and negative for celiac disease.  She has gained about 13 pounds since she took budesonide and wonders if that is related.  She states her reflux has generally been pretty well controlled on the Pepcid.  She has had some nausea and some epigastric gnawing symptoms that have bothered her a bit more recently since starting the budesonide and she wonders if related to that as well.  Overall she is quite happy with her progress.  Colonoscopy 02/06/2014 - good prep, no polyps, internal hemorrhoids -   EGD 01/29/14 - normal - exam done for bleeding  Colonoscopy 01/19/20 - The perianal and  digital rectal examinations were normal. - The terminal ileum appeared normal. - Internal hemorrhoids were found during retroflexion. - The exam was otherwise without abnormality. No polyps. No overt inflammatory changes. - Biopsies for histology were taken with a cold forceps from the right colon, left colon and transverse colon for evaluation of microscopic colitis.  Surgical [P], random colon sites - COLITIS CONSISTENT WITH LYMPHOCYTIC COLITIS. - SEE MICROSCOPIC DESCRIPTION   Past Medical History:  Diagnosis Date  . Allergic rhinitis, cause unspecified   . Diaphragmatic hernia without mention of obstruction or gangrene   . GERD (gastroesophageal reflux disease)   . Lymphocytic colitis   . Other and unspecified ovarian cyst   . Other disorder of menstruation and other abnormal bleeding from female genital tract   . Plantar fascial fibromatosis   . Schatzki's ring   . Stricture and stenosis of esophagus      Past Surgical History:  Procedure Laterality Date  . BACK SURGERY     lumbar  . ESOPHAGOGASTRODUODENOSCOPY (EGD) WITH PROPOFOL N/A 01/29/2014   Procedure: ESOPHAGOGASTRODUODENOSCOPY (EGD) WITH PROPOFOL;  Surgeon: Louis Meckel, MD;  Location: [facility];  Service: Endoscopy;  Laterality: N/A;  . KNEE SURGERY Right 02/2012   trorn meniscus repair  . TONSILLECTOMY    . TOTAL KNEE ARTHROPLASTY Right 10/22/2017   Procedure: RIGHT TOTAL KNEE ARTHROPLASTY;  Surgeon: Eugenia Mcalpine, MD;  Location: [facility];  Service: Orthopedics;  Laterality:  Right;   Family History  Problem Relation Age of Onset  . Hypertension Mother   . Colon cancer Neg Hx   . Esophageal cancer Neg Hx   . Rectal cancer Neg Hx   . Stomach cancer Neg Hx    Social History   Tobacco Use  . Smoking status: Never Smoker  . Smokeless tobacco: Never Used  Vaping Use  . Vaping Use: Never used  Substance Use Topics  . Alcohol use: No  . Drug use: No   Current Outpatient Medications  Medication Sig  Dispense Refill  . acetaminophen (TYLENOL) 500 MG tablet Take 500 mg by mouth 2 (two) times daily as needed for mild pain or headache.     . Ascorbic Acid (VITAMIN C) 1000 MG tablet Take 1,000 mg by mouth daily.    . cholecalciferol (VITAMIN D) 1000 units tablet Take 1,000 Units by mouth daily.    [name] loperamide (IMODIUM A-D) 2 MG tablet Take 2 mg by mouth 4 (four) times daily as needed for diarrhea or loose stools.    [name] loratadine (CLARITIN) 10 MG tablet Take 10 mg by mouth every morning.    . Multiple Vitamins-Minerals (MULTIVITAMIN PO) Take 1 tablet by mouth every morning. Women's Centrum Silver    . predniSONE (DELTASONE) 20 MG tablet Take 20 mg by mouth daily with breakfast.    . famotidine (PEPCID) 20 MG tablet Take 1 tablet (20 mg total) by mouth 2 (two) times daily.     No current facility-administered medications for this visit.   Allergies  Allergen Reactions  . Prednisone   . Propoxyphene   . Amoxicillin Rash  . Carisprodol [Carisoprodol] Itching and Rash  . Doxycycline Itching and Rash  . Penicillins Rash    Has patient had a PCN reaction causing immediate rash, facial/tongue/throat swelling, SOB or lightheadedness with hypotension: yes Has patient had a PCN reaction causing severe rash involving mucus membranes or skin necrosis: no Has patient had a PCN reaction that required hospitalization: no Has patient had a PCN reaction occurring within the last 10 years: yes If all of the above answers are "NO", then may proceed with Cephalosporin use.      Review of Systems: All systems reviewed and negative except where noted in HPI.   Lab Results  Component Value Date   WBC 5.1 12/27/2019   HGB 12.8 12/27/2019   HCT 38.5 12/27/2019   MCV 90.6 12/27/2019   PLT 205.0 12/27/2019    Lab Results  Component Value Date   CREATININE 0.60 12/27/2019   BUN 10 12/27/2019   NA 140 12/27/2019   K 3.8 12/27/2019   CL 104 12/27/2019   CO2 29 12/27/2019    Lab Results   Component Value Date   ALT 19 12/27/2019   AST 24 12/27/2019   ALKPHOS 73 12/27/2019   BILITOT 0.8 12/27/2019     Physical Exam: BP 118/66   Pulse 70   Ht 5' 4.5" (1.638 m)   Wt 270 lb (122.5 kg)   BMI 45.63 kg/m  Constitutional: Pleasant,well-developed, female in no acute distress. Neurological: Alert and oriented to person place and time. Psychiatric: Normal mood and affect. Behavior is normal.   ASSESSMENT AND PLAN: 64 year old female here for reassessment following:  Lymphocytic colitis - noted on colonoscopy when evaluating her symptoms of chronic loose stools and fecal incontinence.  This is the cause of her symptoms and I discussed what this is with her.  Her risk factor for this was  chronic PPI use.  We have transitioned her off omeprazole and to Pepcid which seems to be controlling her reflux fairly well as below.  She was treated with budesonide and had an expected result, overall doing much better and due to stop budesonide today.  Discussed options moving forward.  Hopefully with cessation of omeprazole she does not have recurrence although it is possible she could regardless.  I recommend she continue to avoid NSAIDs in case that was related.  She tested negative for celiac disease and had normal thyroid levels.  Moving forward we will see how she does off budesonide.  She can use bismuth as needed and Imodium as needed moving forward and see if this regimen can control her symptoms if they recur.  Discussed with her that it is possible she may need budesonide in the future if she has recurrent flares.  Unclear if budesonide led to weight gain or just treatment of her diarrhea, she will monitor this moving forward.  I would like to see her back in 4 months or so for reassessment to assess her course.  She will call me in the interim if she has any worsening or recurrence of symptoms.  She agreed  GERD - generally doing pretty well with Pepcid once daily and is controlling her  symptoms although unclear if she had a side effect from budesonide or some mild reflux in regards to her epigastric gnawing sensation.  She can increase her Pepcid to twice a day for right now and see if that helps, will await her course of budesonide.  Ileene Patrick, MD [facility]

## 2020-03-28 NOTE — Patient Instructions (Signed)
If you are age 65 or older, your body mass index should be between 23-30. Your Body mass index is 45.63 kg/m. If this is out of the aforementioned range listed, please consider follow up with your Primary Care Provider.  If you are age 39 or younger, your body mass index should be between 19-25. Your Body mass index is 45.63 kg/m. If this is out of the aformentioned range listed, please consider follow up with your Primary Care Provider.   Discontinue budesonide.  Please purchase the following medications over the counter and take as directed: Pepto-Bismol: Take as needed  Continue Imodium, as directed, as needed for recurrent symptoms.  Hold off on omeprazole.  Increase Pepcid to twice a day.  Please follow up in March 2022.  Thank you for entrusting me with your care and for choosing [facility], Dr. Ileene Patrick

## 2020-04-05 IMAGING — MG DIGITAL SCREENING BILATERAL MAMMOGRAM WITH TOMO AND CAD
8 series · 8 of 24 positions shown · non-contrast
Comparison: Previous exam(s).

ACR Breast Density Category a: The breast tissue is almost entirely
fatty.

CLINICAL DATA: Screening.

EXAM:
DIGITAL SCREENING BILATERAL MAMMOGRAM WITH TOMO AND CAD

[R MLO synth-2D]
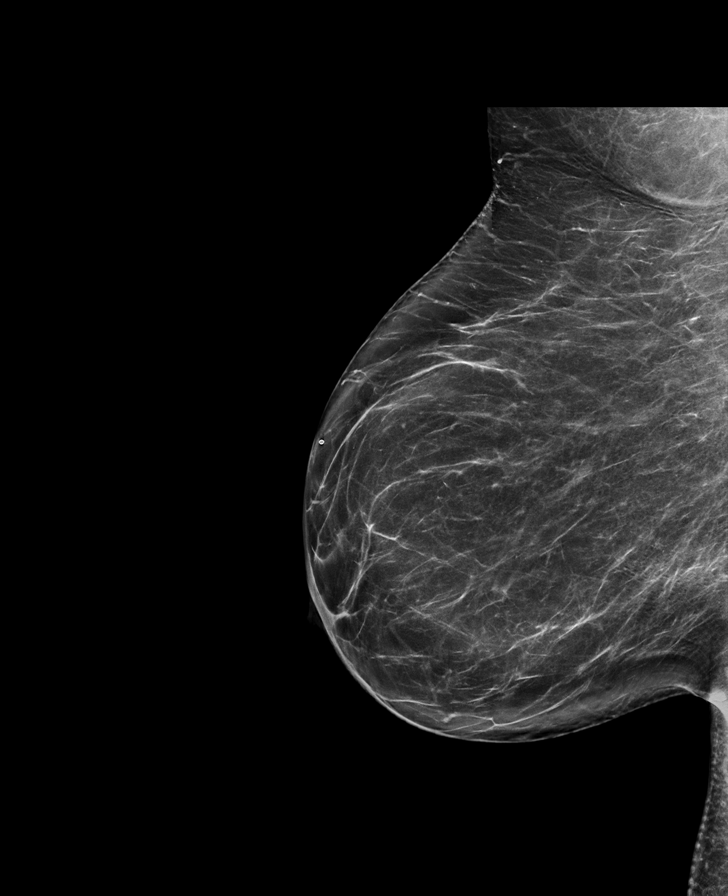

[L MLO synth-2D]
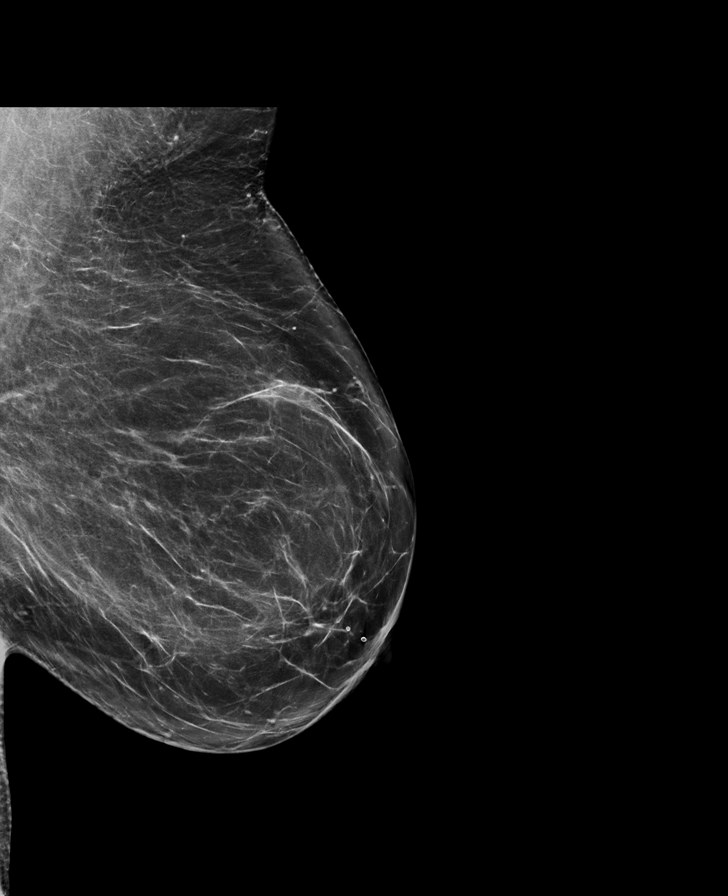

[L CC synth-2D]
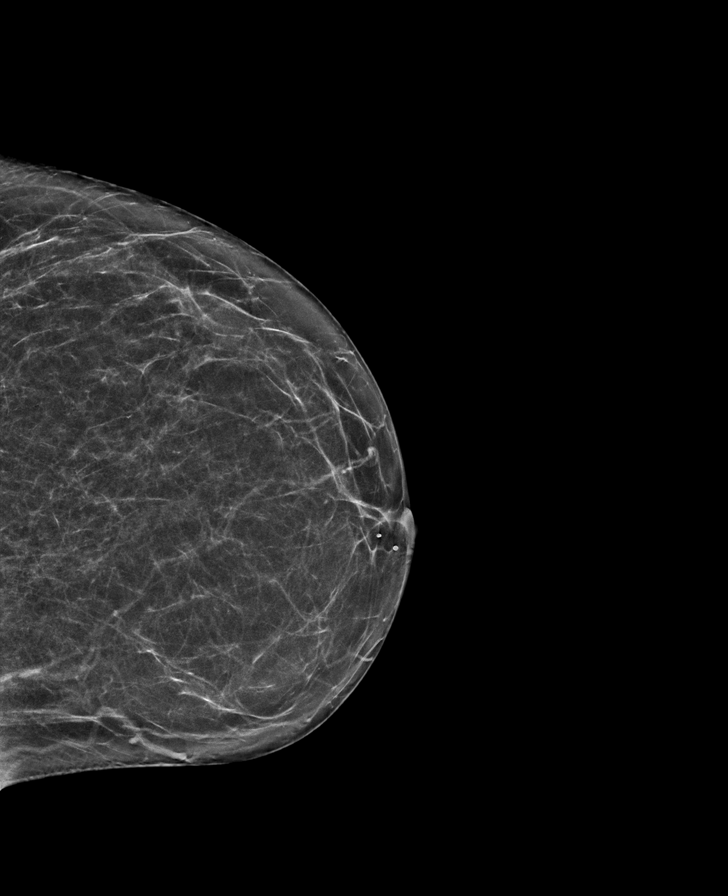

[R CC synth-2D]
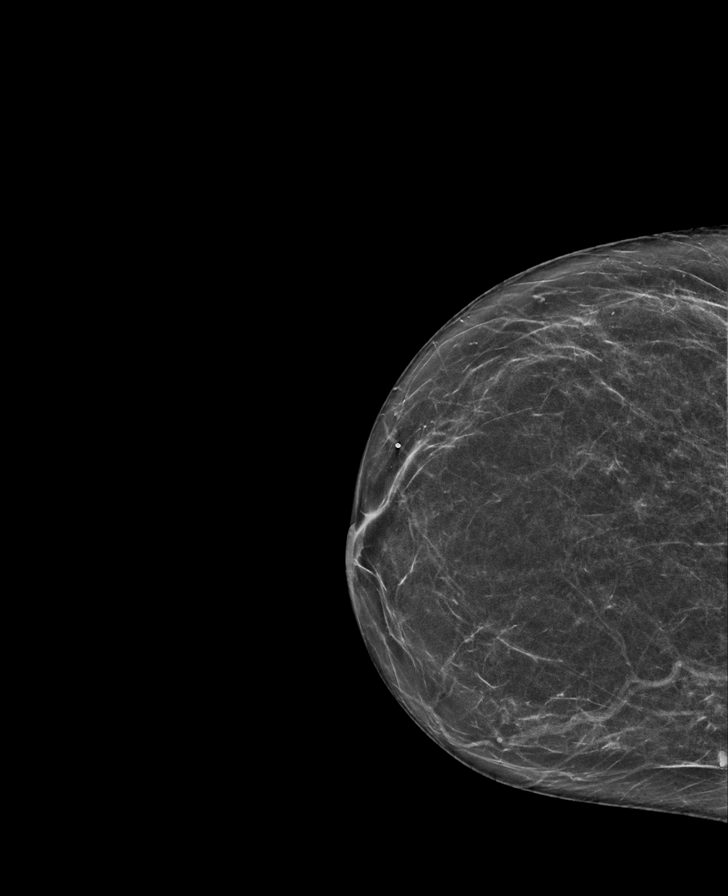

[R MLO tomo · tomo slice 39/77.0]
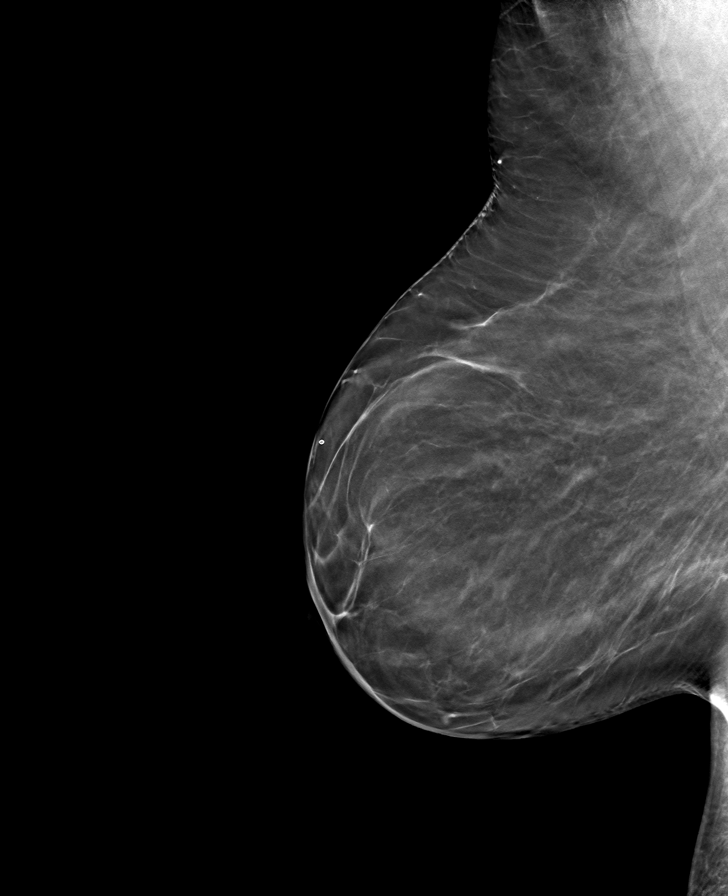

[R CC tomo · tomo slice 31/61.0]
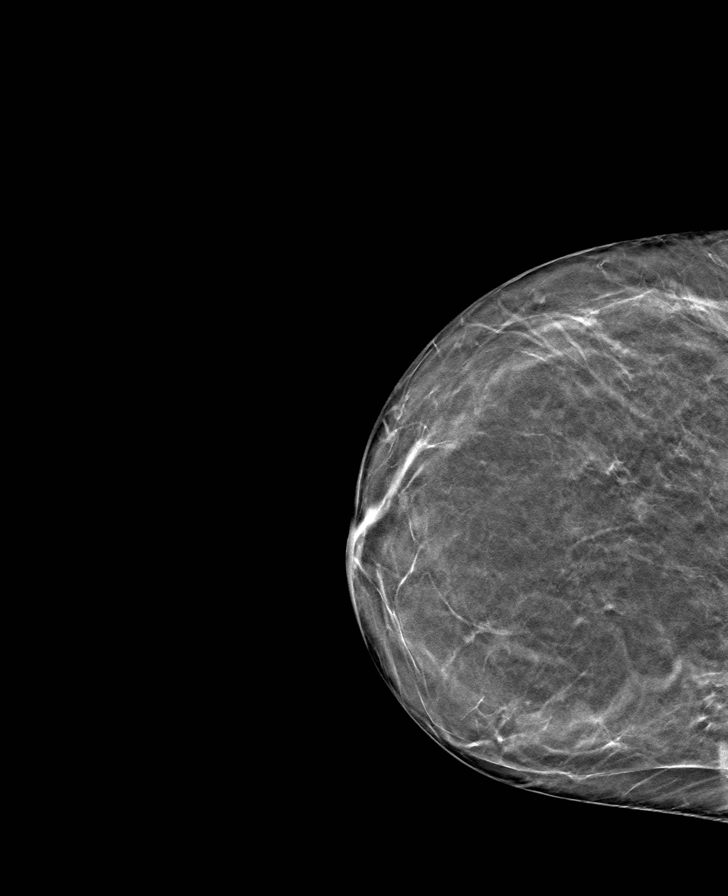

[L MLO tomo · tomo slice 42/83.0]
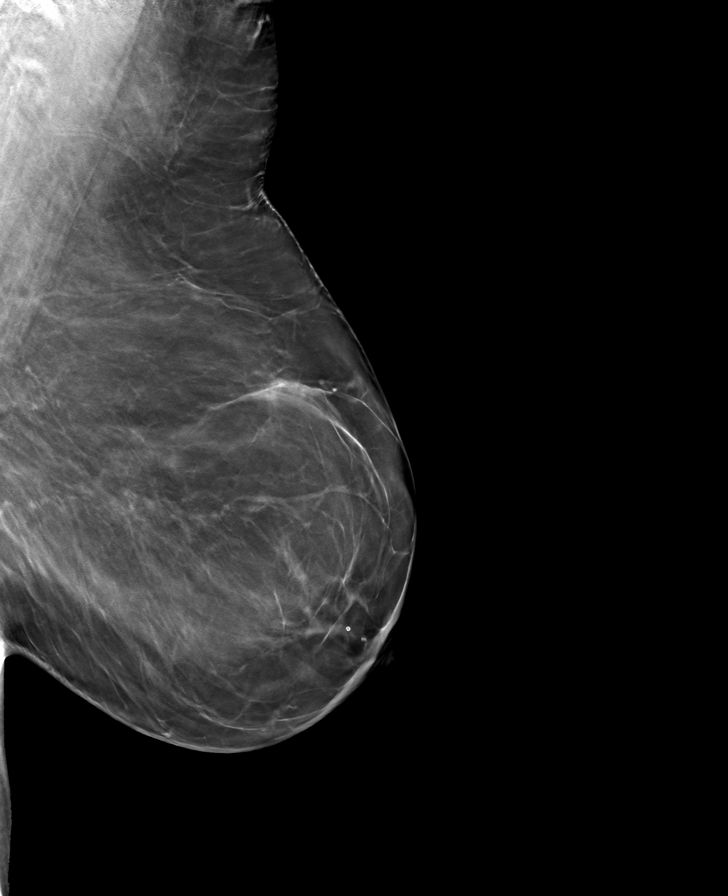

[L CC tomo · tomo slice 31/61.0]
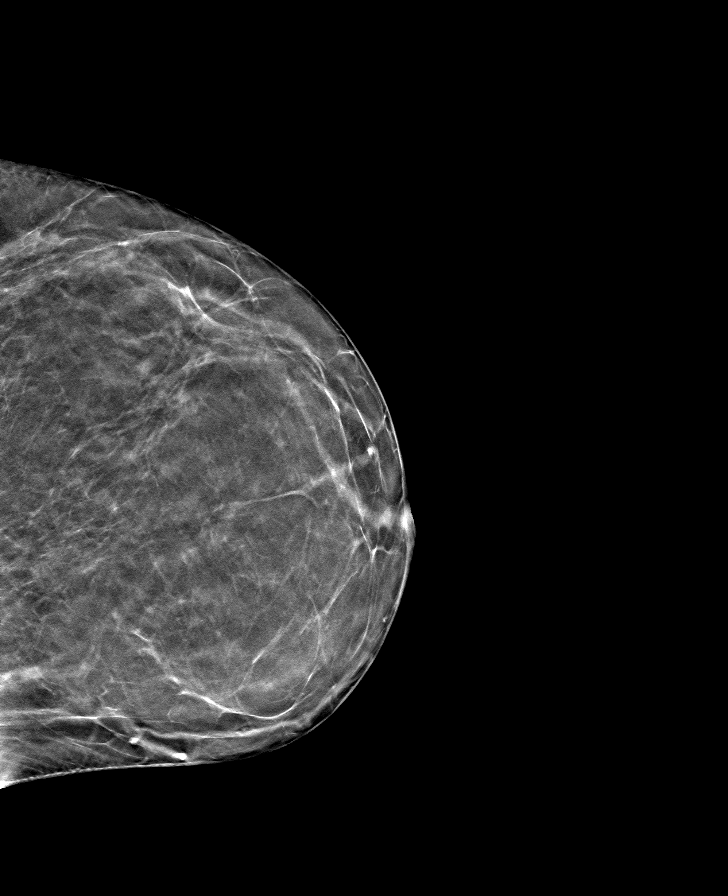

[8 of 24 positions shown; findings below may reference images not displayed]

FINDINGS: There are no findings suspicious for malignancy. Images were
processed with CAD.
IMPRESSION: No mammographic evidence of malignancy. A result letter of this
screening mammogram will be mailed directly to the patient.

RECOMMENDATION:
Screening mammogram in one year. (Code:[id number])

BI-RADS CATEGORY  1: Negative.

## 2020-04-24 ENCOUNTER — Other Ambulatory Visit (HOSPITAL_COMMUNITY)
Admission: RE | Admit: 2020-04-24 | Discharge: 2020-04-24 | Disposition: A | Discharge status: Home / Self Care | Payer: PPO | Source: Ambulatory Visit | Attending: Family Medicine | Admitting: Family Medicine

## 2020-04-24 DIAGNOSIS — Z1151 Encounter for screening for human papillomavirus (HPV): Secondary | ICD-10-CM | POA: Diagnosis not present

## 2020-04-24 DIAGNOSIS — N939 Abnormal uterine and vaginal bleeding, unspecified: Secondary | ICD-10-CM | POA: Diagnosis not present

## 2020-04-24 DIAGNOSIS — Z124 Encounter for screening for malignant neoplasm of cervix: Secondary | ICD-10-CM | POA: Diagnosis not present

## 2020-04-29 DIAGNOSIS — N950 Postmenopausal bleeding: Secondary | ICD-10-CM | POA: Diagnosis not present

## 2020-04-29 LAB — CYTOLOGY - PAP
Comment: NEGATIVE
Diagnosis: NEGATIVE
High risk HPV: NEGATIVE

## 2020-05-21 DIAGNOSIS — N950 Postmenopausal bleeding: Secondary | ICD-10-CM | POA: Diagnosis not present

## 2020-07-03 ENCOUNTER — Ambulatory Visit: Payer: PPO | Admitting: Gastroenterology

## 2020-11-04 DIAGNOSIS — Z0000 Encounter for general adult medical examination without abnormal findings: Secondary | ICD-10-CM | POA: Diagnosis not present

## 2020-11-04 DIAGNOSIS — B372 Candidiasis of skin and nail: Secondary | ICD-10-CM | POA: Diagnosis not present

## 2020-11-04 DIAGNOSIS — Z136 Encounter for screening for cardiovascular disorders: Secondary | ICD-10-CM | POA: Diagnosis not present

## 2020-11-04 DIAGNOSIS — Z79899 Other long term (current) drug therapy: Secondary | ICD-10-CM | POA: Diagnosis not present

## 2020-11-04 DIAGNOSIS — Z23 Encounter for immunization: Secondary | ICD-10-CM | POA: Diagnosis not present

## 2020-11-11 ENCOUNTER — Other Ambulatory Visit: Payer: Self-pay | Admitting: Family Medicine

## 2020-11-11 DIAGNOSIS — Z1231 Encounter for screening mammogram for malignant neoplasm of breast: Secondary | ICD-10-CM

## 2020-12-20 ENCOUNTER — Ambulatory Visit
Admission: RE | Admit: 2020-12-20 | Discharge: 2020-12-20 | Disposition: A | Discharge status: Home / Self Care | Payer: PPO | Source: Ambulatory Visit | Attending: Family Medicine | Admitting: Family Medicine

## 2020-12-20 ENCOUNTER — Other Ambulatory Visit: Payer: Self-pay

## 2020-12-20 DIAGNOSIS — Z1231 Encounter for screening mammogram for malignant neoplasm of breast: Secondary | ICD-10-CM

## 2021-02-06 DIAGNOSIS — L710 Perioral dermatitis: Secondary | ICD-10-CM | POA: Diagnosis not present

## 2021-02-20 ENCOUNTER — Ambulatory Visit
Admission: RE | Admit: 2021-02-20 | Discharge: 2021-02-20 | Disposition: A | Discharge status: Home / Self Care | Payer: PPO | Source: Ambulatory Visit | Attending: Family Medicine | Admitting: Family Medicine

## 2021-02-20 ENCOUNTER — Other Ambulatory Visit: Payer: Self-pay | Admitting: Family Medicine

## 2021-02-20 DIAGNOSIS — M542 Cervicalgia: Secondary | ICD-10-CM | POA: Diagnosis not present

## 2021-02-20 DIAGNOSIS — M549 Dorsalgia, unspecified: Secondary | ICD-10-CM | POA: Diagnosis not present

## 2021-02-20 DIAGNOSIS — M47812 Spondylosis without myelopathy or radiculopathy, cervical region: Secondary | ICD-10-CM | POA: Diagnosis not present

## 2021-02-20 DIAGNOSIS — M4312 Spondylolisthesis, cervical region: Secondary | ICD-10-CM | POA: Diagnosis not present

## 2021-02-26 ENCOUNTER — Ambulatory Visit: Payer: PPO | Attending: Family Medicine

## 2021-02-26 ENCOUNTER — Other Ambulatory Visit: Payer: Self-pay

## 2021-02-26 DIAGNOSIS — M6281 Muscle weakness (generalized): Secondary | ICD-10-CM | POA: Diagnosis not present

## 2021-02-26 DIAGNOSIS — M542 Cervicalgia: Secondary | ICD-10-CM | POA: Diagnosis not present

## 2021-02-26 DIAGNOSIS — M62838 Other muscle spasm: Secondary | ICD-10-CM | POA: Insufficient documentation

## 2021-02-26 NOTE — Patient Instructions (Signed)
Initiated [facility] Access Code: [email or web address] URL: [email or web address] Date: 02/26/2021 Prepared by: Mikey Kirschner  Exercises Seated Cervical Flexion AROM - 1 x daily - 7 x weekly - 1 sets - 5 reps Seated Cervical Retraction - 1 x daily - 7 x weekly - 1 sets - 5 reps Seated Cervical Rotation AROM - 1 x daily - 7 x weekly - 1 sets - 5 reps Seated Cervical Sidebending AROM - 1 x daily - 7 x weekly - 1 sets - 5 reps Seated Upper Trapezius Stretch - 1 x daily - 7 x weekly - 1 sets - 5 reps - 10 sec hold Seated Levator Scapulae Stretch - 1 x daily - 7 x weekly - 1 sets - 5 reps - 10 sec hold Shoulder extension with resistance - Neutral - 1 x daily - 7 x weekly - 2 sets - 10 reps Standing Shoulder Row with Anchored Resistance - 1 x daily - 7 x weekly - 2 sets - 10 reps Shoulder External Rotation and Scapular Retraction with Resistance - 1 x daily - 7 x weekly - 2 sets - 10 reps Standing Shoulder Horizontal Abduction with Resistance - 1 x daily - 7 x weekly - 2 sets - 10 reps
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
## 2021-02-27 ENCOUNTER — Encounter: Payer: PPO | Admitting: Physical Therapy

## 2021-03-18 ENCOUNTER — Encounter: Payer: Self-pay | Admitting: Physical Therapy

## 2021-03-25 ENCOUNTER — Encounter: Payer: Self-pay | Admitting: Physical Therapy

## 2021-03-25 DIAGNOSIS — J189 Pneumonia, unspecified organism: Secondary | ICD-10-CM | POA: Diagnosis not present

## 2021-04-01 ENCOUNTER — Encounter: Payer: Self-pay | Admitting: Physical Therapy

## 2021-04-08 ENCOUNTER — Encounter: Payer: Self-pay | Admitting: Physical Therapy

## 2021-09-29 DIAGNOSIS — B349 Viral infection, unspecified: Secondary | ICD-10-CM | POA: Diagnosis not present

## 2021-10-02 DIAGNOSIS — J069 Acute upper respiratory infection, unspecified: Secondary | ICD-10-CM | POA: Diagnosis not present

## 2021-10-20 DIAGNOSIS — J069 Acute upper respiratory infection, unspecified: Secondary | ICD-10-CM | POA: Diagnosis not present

## 2021-10-20 DIAGNOSIS — R070 Pain in throat: Secondary | ICD-10-CM | POA: Diagnosis not present

## 2021-10-20 DIAGNOSIS — Z20822 Contact with and (suspected) exposure to covid-19: Secondary | ICD-10-CM | POA: Diagnosis not present

## 2021-11-11 DIAGNOSIS — Z0000 Encounter for general adult medical examination without abnormal findings: Secondary | ICD-10-CM | POA: Diagnosis not present

## 2021-11-11 DIAGNOSIS — Z79899 Other long term (current) drug therapy: Secondary | ICD-10-CM | POA: Diagnosis not present

## 2021-11-12 ENCOUNTER — Other Ambulatory Visit: Payer: Self-pay | Admitting: Family Medicine

## 2021-11-12 DIAGNOSIS — Z1231 Encounter for screening mammogram for malignant neoplasm of breast: Secondary | ICD-10-CM

## 2021-11-26 DIAGNOSIS — M79672 Pain in left foot: Secondary | ICD-10-CM | POA: Diagnosis not present

## 2021-11-26 DIAGNOSIS — L600 Ingrowing nail: Secondary | ICD-10-CM | POA: Diagnosis not present

## 2021-12-10 DIAGNOSIS — B351 Tinea unguium: Secondary | ICD-10-CM | POA: Diagnosis not present

## 2021-12-10 DIAGNOSIS — M79671 Pain in right foot: Secondary | ICD-10-CM | POA: Diagnosis not present

## 2021-12-10 DIAGNOSIS — M79672 Pain in left foot: Secondary | ICD-10-CM | POA: Diagnosis not present

## 2021-12-23 ENCOUNTER — Ambulatory Visit
Admission: RE | Admit: 2021-12-23 | Discharge: 2021-12-23 | Disposition: A | Discharge status: Home / Self Care | Payer: PPO | Source: Ambulatory Visit | Attending: Family Medicine | Admitting: Family Medicine

## 2021-12-23 DIAGNOSIS — Z1231 Encounter for screening mammogram for malignant neoplasm of breast: Secondary | ICD-10-CM

## 2021-12-30 DIAGNOSIS — M170 Bilateral primary osteoarthritis of knee: Secondary | ICD-10-CM | POA: Diagnosis not present

## 2021-12-30 DIAGNOSIS — M25562 Pain in left knee: Secondary | ICD-10-CM | POA: Diagnosis not present

## 2021-12-30 DIAGNOSIS — M1712 Unilateral primary osteoarthritis, left knee: Secondary | ICD-10-CM | POA: Diagnosis not present

## 2022-01-06 DIAGNOSIS — M1712 Unilateral primary osteoarthritis, left knee: Secondary | ICD-10-CM | POA: Diagnosis not present

## 2022-01-13 DIAGNOSIS — M1712 Unilateral primary osteoarthritis, left knee: Secondary | ICD-10-CM | POA: Diagnosis not present

## 2022-01-23 DIAGNOSIS — M1712 Unilateral primary osteoarthritis, left knee: Secondary | ICD-10-CM | POA: Diagnosis not present

## 2022-02-03 ENCOUNTER — Encounter (INDEPENDENT_AMBULATORY_CARE_PROVIDER_SITE_OTHER): Payer: Self-pay | Admitting: Family Medicine

## 2022-02-03 ENCOUNTER — Ambulatory Visit (INDEPENDENT_AMBULATORY_CARE_PROVIDER_SITE_OTHER): Payer: PPO | Admitting: Family Medicine

## 2022-02-03 VITALS — BP 131/85 | HR 83 | Temp 98.4°F | Ht 64.0 in | Wt 272.0 lb

## 2022-02-03 DIAGNOSIS — Z6841 Body Mass Index (BMI) 40.0 and over, adult: Secondary | ICD-10-CM

## 2022-02-03 DIAGNOSIS — K219 Gastro-esophageal reflux disease without esophagitis: Secondary | ICD-10-CM | POA: Diagnosis not present

## 2022-02-03 DIAGNOSIS — Z636 Dependent relative needing care at home: Secondary | ICD-10-CM

## 2022-02-03 DIAGNOSIS — R7303 Prediabetes: Secondary | ICD-10-CM | POA: Diagnosis not present

## 2022-02-03 DIAGNOSIS — M170 Bilateral primary osteoarthritis of knee: Secondary | ICD-10-CM

## 2022-02-03 DIAGNOSIS — M1712 Unilateral primary osteoarthritis, left knee: Secondary | ICD-10-CM | POA: Insufficient documentation

## 2022-02-05 ENCOUNTER — Ambulatory Visit (INDEPENDENT_AMBULATORY_CARE_PROVIDER_SITE_OTHER): Payer: PPO | Admitting: Adult Health

## 2022-02-10 ENCOUNTER — Ambulatory Visit (INDEPENDENT_AMBULATORY_CARE_PROVIDER_SITE_OTHER): Payer: PPO | Admitting: Internal Medicine

## 2022-02-10 ENCOUNTER — Encounter (INDEPENDENT_AMBULATORY_CARE_PROVIDER_SITE_OTHER): Payer: Self-pay | Admitting: Internal Medicine

## 2022-02-10 VITALS — BP 132/76 | HR 78 | Temp 97.9°F | Ht 64.0 in | Wt 269.8 lb

## 2022-02-10 DIAGNOSIS — R7303 Prediabetes: Secondary | ICD-10-CM

## 2022-02-10 DIAGNOSIS — Z6841 Body Mass Index (BMI) 40.0 and over, adult: Secondary | ICD-10-CM | POA: Diagnosis not present

## 2022-02-10 DIAGNOSIS — E669 Obesity, unspecified: Secondary | ICD-10-CM | POA: Diagnosis not present

## 2022-02-10 DIAGNOSIS — Z0289 Encounter for other administrative examinations: Secondary | ICD-10-CM

## 2022-02-17 ENCOUNTER — Ambulatory Visit (INDEPENDENT_AMBULATORY_CARE_PROVIDER_SITE_OTHER): Payer: PPO | Admitting: Internal Medicine

## 2022-02-18 NOTE — Progress Notes (Signed)
     Chief Complaint:   Tammy [name] was scheduled for a nutritional consult following her introductory session. Her note for that day is unavailable. She has a history of pre-diabetes and she is interested in losing weight to reduce surgical risks. She is in need of knee surgery. She is not following a nutritional plan, no physical activity, and she does not like to journal or count calories. Her predicted BMR today is 1659.  Starting weight: 272 lbs Starting date: 02/13/2022 Today's weight: 269 lbs Today's date: 02/10/2022 Total lbs lost to date: 3 Total lbs lost since last in-office visit: 3  Subjective:   1. Prediabetes Tammy [name] has pre-diabetes per history. She has no recent labs available for review. Her last labs were done at [facility].    Assessment/Plan:   1. Prediabetes [name] will continue work on weight loss, exercise, and decreasing simple carbohydrates to help decrease the risk of diabetes.   2. Obesity with current BMI 46.3 Tammy [name] is currently in the action stage of change. As such, her goal is to continue with weight loss efforts. She has agreed to the Category 2 Plan. Meal plan framework was introduced, and also the importance of protein in weight loss.   Tammy [name] has decided to enroll in our program. She will complete a health questionnaire and schedule a follow-up with Dr. Raliegh Scarlet.   Exercise goals: No exercise has been prescribed at this time.  Behavioral modification strategies: increasing lean protein intake, decreasing simple carbohydrates, increasing water intake, no skipping meals, and planning for success.  [name] has agreed to follow-up with our clinic in 2 weeks for a new patient appointment. She was informed of the importance of frequent follow-up visits to maximize her success with intensive lifestyle modifications for her multiple health conditions.   Objective:   Blood pressure 132/76, pulse 78, temperature 97.9 F (36.6 C), height 5'  4" (1.626 m), weight 269 lb 12.8 oz (122.4 kg), SpO2 97 %. Body mass index is 46.31 kg/m.  General: Cooperative, alert, well developed, in no acute distress. HEENT: Conjunctivae and lids unremarkable. Cardiovascular: Regular rhythm.  Lungs: Normal work of breathing. Neurologic: No focal deficits.   Lab Results  Component Value Date   CREATININE 0.60 12/27/2019   BUN 10 12/27/2019   NA 140 12/27/2019   K 3.8 12/27/2019   CL 104 12/27/2019   CO2 29 12/27/2019   Lab Results  Component Value Date   ALT 19 12/27/2019   AST 24 12/27/2019   ALKPHOS 73 12/27/2019   BILITOT 0.8 12/27/2019   No results found for: "HGBA1C" No results found for: "INSULIN" Lab Results  Component Value Date   TSH 1.87 12/27/2019   No results found for: "CHOL", "HDL", "LDLCALC", "LDLDIRECT", "TRIG", "CHOLHDL" No results found for: "VD25OH" Lab Results  Component Value Date   WBC 5.1 12/27/2019   HGB 12.8 12/27/2019   HCT 38.5 12/27/2019   MCV 90.6 12/27/2019   PLT 205.0 12/27/2019   No results found for: "IRON", "TIBC", "FERRITIN"  Attestation Statements:   Reviewed by clinician on day of visit: allergies, medications, problem list, medical history, surgical history, family history, social history, and previous encounter notes.  Time spent on visit including pre-visit chart review and post-visit care and charting was 30 minutes.   Wilhemena Durie, am acting as transcriptionist for Thomes Dinning, MD.  I have reviewed the above documentation for accuracy and completeness, and I agree with the above. -Thomes Dinning, MD

## 2022-02-24 ENCOUNTER — Encounter (INDEPENDENT_AMBULATORY_CARE_PROVIDER_SITE_OTHER): Payer: Self-pay | Admitting: Family Medicine

## 2022-02-24 ENCOUNTER — Ambulatory Visit (INDEPENDENT_AMBULATORY_CARE_PROVIDER_SITE_OTHER): Payer: PPO | Admitting: Family Medicine

## 2022-02-24 VITALS — BP 113/77 | HR 69 | Temp 98.2°F | Ht 64.0 in | Wt 264.0 lb

## 2022-02-24 DIAGNOSIS — E559 Vitamin D deficiency, unspecified: Secondary | ICD-10-CM

## 2022-02-24 DIAGNOSIS — Z1331 Encounter for screening for depression: Secondary | ICD-10-CM | POA: Diagnosis not present

## 2022-02-24 DIAGNOSIS — M1712 Unilateral primary osteoarthritis, left knee: Secondary | ICD-10-CM | POA: Diagnosis not present

## 2022-02-24 DIAGNOSIS — Z6841 Body Mass Index (BMI) 40.0 and over, adult: Secondary | ICD-10-CM | POA: Diagnosis not present

## 2022-02-24 DIAGNOSIS — K219 Gastro-esophageal reflux disease without esophagitis: Secondary | ICD-10-CM | POA: Diagnosis not present

## 2022-02-24 DIAGNOSIS — F3289 Other specified depressive episodes: Secondary | ICD-10-CM

## 2022-02-24 DIAGNOSIS — R7303 Prediabetes: Secondary | ICD-10-CM

## 2022-02-24 DIAGNOSIS — M170 Bilateral primary osteoarthritis of knee: Secondary | ICD-10-CM

## 2022-02-24 DIAGNOSIS — R0602 Shortness of breath: Secondary | ICD-10-CM

## 2022-02-24 DIAGNOSIS — R5383 Other fatigue: Secondary | ICD-10-CM | POA: Insufficient documentation

## 2022-02-24 NOTE — Progress Notes (Signed)
Office: [phone]  /  Fax: [phone]  Initial Visit  Tammy [name] was seen in clinic today to evaluate for obesity. She is interested in losing weight to improve overall health and reduce the risk of weight related complications. She presents today to review program treatment options, initial physical assessment, and evaluation.     She was referred by: Specialist- Dr. Theda Sers at [facility] for weight loss to have left knee replacement.  When asked what else they would they like to accomplish? She states: Improve existing medical conditions, Reduce number of medications, Improve quality of life, and Improve self-confidence  When asked how has your weight affected you? She states: Has affected self-esteem, Contributed to medical problems, Contributed to orthopedic problems or mobility issues, and Other: affects how she plays with her 2 grandkids, ages 45 and 31.  Some associated conditions: Arthritis, Prediabetes, and GERD  Contributing factors: Family history- mom's side is obese, Nutritional- microwave meals, and Life event- lost both parents 10 years ago and is tearful.  Weight promoting medications identified: Steroids  Current nutrition plan: Other: Nutri-System  Current level of physical activity: None  Current or previous pharmacotherapy: None  Response to medication: Never tried medications   Past medical history includes:   Past Medical History:  Diagnosis Date   Allergic rhinitis, cause unspecified    Back pain    Diaphragmatic hernia without mention of obstruction or gangrene    GERD (gastroesophageal reflux disease)    IBS (irritable bowel syndrome)    Lymphocytic colitis    Osteoarthritis    Other and unspecified ovarian cyst    Other disorder of menstruation and other abnormal bleeding from female genital tract    Plantar fascial fibromatosis    Prediabetes    Schatzki's ring    Stomach ulcer    Stricture and stenosis of esophagus    Vitamin D  deficiency      Objective:   BP 131/85   Pulse 83   Temp 98.4 F (36.9 C)   Ht 5\' 4"  (1.626 m)   Wt 272 lb (123.4 kg)   SpO2 97%   BMI 46.69 kg/m  She was weighed on the bioimpedance scale: Body mass index is 46.69 kg/m.  Visceral Fat Rating: 22, Body Fat%: 58.2  General:  Alert, oriented and cooperative. Patient is in no acute distress.  Respiratory: Normal respiratory effort, no problems with respiration noted  Extremities: Normal range of motion.    Mental Status: Normal mood and affect. Normal behavior. Normal judgment and thought content.   Assessment and Plan:  1. Prediabetes Pt with financial stressors and eats out a lot and has a lot of microwave meals.  No meds currently. Discussion with pt about how foods affect her insulin and sugar levels.  2. Osteoarthritis of both knees, unspecified osteoarthritis type Pt is unable to stand for long periods to cook and it is difficult to climb stairs. She is under the care of Dr. Theda Sers at [facility].  BMI needs to be less than 40. Counseling done on how weight loss will have a positive affect on OA.  3. Caregiver stress Tammy [name]'s older sister is in a nursing home and pt cares for her during the day whenever possible.  Discussed with pt that we also have a counselor for emotional eating. Importance of self-care discussed and that she needs to take care of herself so she can take care of others.  4. Gastroesophageal reflux disease, unspecified whether esophagitis present Symptoms well controlled  on meds. Pt with history of esophageal stricture. She has dilation many years go and several times.  Discussed with pt that weight loss can increase severity of symptoms of GERD in most people.  5. Class 3 severe obesity with serious comorbidity and body mass index (BMI) of 45.0 to 49.9 in adult, unspecified obesity type (HCC) Pt has significant financial burdens currently and just spent $200/month on 2 months of Nutri-System  foods. She desires the 1 time nutritional c/s with an APP. Pt understands it's only 1 visit.    Obesity Treatment Plan:  She will work on garnering support from family and friends to begin weight loss journey. Work on eliminating or reducing the presence of highly processed, calorie dense foods in the home. Complete provided nutritional and psychosocial assessment questionnaire prior to her next visit.  Assess for cardiometabolic complications and nutritional deficiencies via fasting serologies.  Assess REE via indirect calorimetry to guide the creation of a reduced calorie, high protein meal plan to promote loss of fat mass.   She will think about ideas on how to incorporate physical activity in their daily routine.  Tammy [name] will follow up in the next 1-2 weeks to review the above steps and continue evaluation and treatment.  Obesity Education Performed Today:  She was weighed on the bioimpedance scale and results were discussed and documented in the synopsis.  We discussed obesity as a disease and the importance of a more detailed evaluation of all the factors contributing to the disease.  We discussed the importance of long term lifestyle changes which include nutrition, exercise and behavioral modifications as well as the importance of customizing this to her specific health and social needs.  We discussed the benefits of reaching a healthier weight to alleviate the symptoms of existing conditions and reduce the risks of the biomechanical, metabolic and psychological effects of obesity.  Tammy [name] appears to be in the action stage of change and states they are ready to start intensive lifestyle modifications and behavioral modifications.  45 minutes was spent today on this visit including the above counseling, pre-visit chart review, and post-visit documentation.  I, Kyung Rudd, BS, CMA, am acting as transcriptionist for [employer], DO.  I have reviewed  the above documentation for accuracy and completeness, and I agree with the above. Carlye Grippe, D.O.  The 21st Century Cures Act was signed into law in 2016 which includes the topic of electronic health records.  This provides immediate access to information in MyChart.  This includes consultation notes, operative notes, office notes, lab results and pathology reports.  If you have any questions about what you read please let us know at your next visit so we can discuss your concerns and take corrective action if need be.  We are right here with you.

## 2022-02-25 DIAGNOSIS — F32A Depression, unspecified: Secondary | ICD-10-CM | POA: Insufficient documentation

## 2022-02-25 DIAGNOSIS — M1712 Unilateral primary osteoarthritis, left knee: Secondary | ICD-10-CM | POA: Insufficient documentation

## 2022-02-25 DIAGNOSIS — E559 Vitamin D deficiency, unspecified: Secondary | ICD-10-CM | POA: Insufficient documentation

## 2022-02-25 LAB — COMPREHENSIVE METABOLIC PANEL
ALT: 26 IU/L (ref 0–32)
AST: 30 IU/L (ref 0–40)
Albumin/Globulin Ratio: 1.8 (ref 1.2–2.2)
Albumin: 4 g/dL (ref 3.9–4.9)
Alkaline Phosphatase: 55 IU/L (ref 44–121)
BUN/Creatinine Ratio: 35 — ABNORMAL HIGH (ref 12–28)
BUN: 22 mg/dL (ref 8–27)
Bilirubin Total: 0.4 mg/dL (ref 0.0–1.2)
CO2: 24 mmol/L (ref 20–29)
Calcium: 9.7 mg/dL (ref 8.7–10.3)
Chloride: 102 mmol/L (ref 96–106)
Creatinine, Ser: 0.62 mg/dL (ref 0.57–1.00)
Globulin, Total: 2.2 g/dL (ref 1.5–4.5)
Glucose: 91 mg/dL (ref 70–99)
Potassium: 4.8 mmol/L (ref 3.5–5.2)
Sodium: 142 mmol/L (ref 134–144)
Total Protein: 6.2 g/dL (ref 6.0–8.5)
eGFR: 98 mL/min/{1.73_m2} (ref >59)

## 2022-02-25 LAB — LIPID PANEL WITH LDL/HDL RATIO
Cholesterol, Total: 149 mg/dL (ref 100–199)
HDL: 52 mg/dL (ref >39)
LDL Chol Calc (NIH): 74 mg/dL (ref 0–99)
LDL/HDL Ratio: 1.4 ratio (ref 0.0–3.2)
Triglycerides: 130 mg/dL (ref 0–149)
VLDL Cholesterol Cal: 23 mg/dL (ref 5–40)

## 2022-02-25 LAB — CBC WITH DIFFERENTIAL/PLATELET
Basophils Absolute: 0 10*3/uL (ref 0.0–0.2)
Basos: 1 %
EOS (ABSOLUTE): 0.4 10*3/uL (ref 0.0–0.4)
Eos: 6 %
Hematocrit: 42.7 % (ref 34.0–46.6)
Hemoglobin: 14.3 g/dL (ref 11.1–15.9)
Immature Grans (Abs): 0 10*3/uL (ref 0.0–0.1)
Immature Granulocytes: 0 %
Lymphocytes Absolute: 2.3 10*3/uL (ref 0.7–3.1)
Lymphs: 39 %
MCH: 31.6 pg (ref 26.6–33.0)
MCHC: 33.5 g/dL (ref 31.5–35.7)
MCV: 95 fL (ref 79–97)
Monocytes Absolute: 0.6 10*3/uL (ref 0.1–0.9)
Monocytes: 11 %
Neutrophils Absolute: 2.6 10*3/uL (ref 1.4–7.0)
Neutrophils: 43 %
Platelets: 232 10*3/uL (ref 150–450)
RBC: 4.52 x10E6/uL (ref 3.77–5.28)
RDW: 13.5 % (ref 11.7–15.4)
WBC: 5.9 10*3/uL (ref 3.4–10.8)

## 2022-02-25 LAB — T4, FREE: Free T4: 1.15 ng/dL (ref 0.82–1.77)

## 2022-02-25 LAB — VITAMIN D 25 HYDROXY (VIT D DEFICIENCY, FRACTURES): Vit D, 25-Hydroxy: 26.8 ng/mL — ABNORMAL LOW (ref 30.0–100.0)

## 2022-02-25 LAB — INSULIN, RANDOM: INSULIN: 9.6 u[IU]/mL (ref 2.6–24.9)

## 2022-02-25 LAB — HEMOGLOBIN A1C
Est. average glucose Bld gHb Est-mCnc: 111 mg/dL
Hgb A1c MFr Bld: 5.5 % (ref 4.8–5.6)

## 2022-02-25 LAB — VITAMIN B12: Vitamin B-12: 737 pg/mL (ref 232–1245)

## 2022-02-25 LAB — TSH: TSH: 1.22 u[IU]/mL (ref 0.450–4.500)

## 2022-03-06 DIAGNOSIS — M1712 Unilateral primary osteoarthritis, left knee: Secondary | ICD-10-CM | POA: Diagnosis not present

## 2022-03-06 DIAGNOSIS — R200 Anesthesia of skin: Secondary | ICD-10-CM | POA: Diagnosis not present

## 2022-03-10 ENCOUNTER — Ambulatory Visit (INDEPENDENT_AMBULATORY_CARE_PROVIDER_SITE_OTHER): Payer: PPO | Admitting: Family Medicine

## 2022-03-10 ENCOUNTER — Encounter (INDEPENDENT_AMBULATORY_CARE_PROVIDER_SITE_OTHER): Payer: Self-pay | Admitting: Family Medicine

## 2022-03-10 VITALS — BP 119/78 | HR 69 | Temp 98.8°F | Ht 64.0 in | Wt 259.0 lb

## 2022-03-10 DIAGNOSIS — E669 Obesity, unspecified: Secondary | ICD-10-CM | POA: Diagnosis not present

## 2022-03-10 DIAGNOSIS — K219 Gastro-esophageal reflux disease without esophagitis: Secondary | ICD-10-CM

## 2022-03-10 DIAGNOSIS — M1712 Unilateral primary osteoarthritis, left knee: Secondary | ICD-10-CM

## 2022-03-10 DIAGNOSIS — R7303 Prediabetes: Secondary | ICD-10-CM

## 2022-03-10 DIAGNOSIS — E559 Vitamin D deficiency, unspecified: Secondary | ICD-10-CM | POA: Diagnosis not present

## 2022-03-10 DIAGNOSIS — Z6841 Body Mass Index (BMI) 40.0 and over, adult: Secondary | ICD-10-CM | POA: Diagnosis not present

## 2022-03-10 MED ORDER — VITAMIN D3 125 MCG (5000 UT) PO TABS: ORAL_TABLET | ORAL | Status: AC

## 2022-03-14 NOTE — Progress Notes (Signed)
Chief Complaint:   OBESITY Tammy [name] (MR# [id number]) is a 66 y.o. female who presents for evaluation and treatment of obesity and related comorbidities. Current BMI is Body mass index is 45.32 kg/m. Reagann has been struggling with her weight for many years and has been unsuccessful in either losing weight, maintaining weight loss, or reaching her healthy weight goal.  Shakiyla is currently in the action stage of change and ready to dedicate time achieving and maintaining a healthier weight. Ardenia is interested in becoming our patient and working on intensive lifestyle modifications including (but not limited to) diet and exercise for weight loss.  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 Pt would like  a protein shake for breakfast daily. She saw Dr. Rikki Spearing for nutritional consult and was placed on category 2 at that time based on calculated BMR (calculated 1604). Measured today was lower than expected at 1138. Pt changed to category 1.  Leean's habits were reviewed today and are as follows: Her family eats meals together, she thinks her family will eat healthier with her, she struggles with family and or coworkers weight loss sabotage, she has been heavy most of her life, she started gaining weight in 1982, her heaviest weight ever was 286 pounds, she has significant food cravings issues, she snacks frequently in the evenings, she is frequently drinking liquids with calories, she frequently makes poor food choices, and she struggles with emotional eating.  Depression Screen Ziasia's Food and Mood (modified PHQ-9) score was 18.       No data to display         Subjective:   1. Other fatigue Bryony admits to daytime somnolence and admits to waking up still tired. Patient has a history of symptoms of daytime fatigue and morning fatigue. Darianny generally gets 5 hours of sleep per night, and states that she has generally  restful sleep. Snoring is present. Apneic episodes are not present. Epworth Sleepiness Score is 8.  Pt falls asleep easily due to not sleeping well at night, secondary to nee pain. She denies concerns with OSA. IC 1138 measured, which is much less than calculated at 1604. Decreased to category 1. 2. SOB (shortness of breath) Gerrianne notes increasing shortness of breath with exercising and seems to be worsening over time with weight gain. She notes getting out of breath sooner with activity than she used to. This has gotten worse recently. Deletha denies shortness of breath at rest or orthopnea.  3. Prediabetes Brittini sees an [place] doctor, therefore, I have no labs to review. She is not sure when she was first diagnosed. Pt is not on medication.  4. Osteoarthritis of left knee, unspecified osteoarthritis type Pt referred by Dr. Thomasena Edis for weight loss so she can have surgery. She has history of right total knee arthroplasty in 2019 by Dr. Thomasena Edis.  5. Gastroesophageal reflux disease, unspecified whether esophagitis present Symptoms aggravated by bread crust, sodas, and spicy foods. Not currently an issue. Medication: Pepcid  6. Vitamin D deficiency Diagnosed years ago. Medication: OTC Vit D 1,000 IU daily  7. Other depression-emotional eating Pt denies depression, depressed mood, or anxiety. PHQ= 18  Assessment/Plan:   Orders Placed This Encounter  Procedures   CBC with Differential/Platelet   Comprehensive metabolic panel   Insulin, random   Lipid Panel With LDL/HDL Ratio   TSH   Vitamin H85   VITAMIN D 25 Hydroxy (Vit-D Deficiency, Fractures)  T4, free   Hemoglobin A1c   EKG 12-Lead    There are no discontinued medications.   No orders of the defined types were placed in this encounter.    1. Other fatigue Ellar does feel that her weight is causing her energy to be lower than it should be. Fatigue may be related to obesity, depression or many other causes. Labs will be  ordered, and in the meanwhile, Jalexa will focus on self care including making healthy food choices, increasing physical activity and focusing on stress reduction.  Lab/Orders today or future: - EKG 12-Lead - Insulin, random - TSH - Vitamin B12 - VITAMIN D 25 Hydroxy (Vit-D Deficiency, Fractures) - T4, free  2. SOB (shortness of breath) Hortencia does feel that she gets out of breath more easily that she used to when she exercises. Tammie's shortness of breath appears to be obesity related and exercise induced. She has agreed to work on weight loss and gradually increase exercise to treat her exercise induced shortness of breath. Will continue to monitor closely.  3. Prediabetes Valeri will continue to work on weight loss, exercise, prudent nutritional plan, and decreasing simple carbohydrates to help decrease the risk of diabetes.   4. Osteoarthritis of left knee, unspecified osteoarthritis type Follow up with Dr. Thomasena Edis regarding definitive management. Weight loss via prudent nutritional plan needed.  5. Gastroesophageal reflux disease, unspecified whether esophagitis present Continue preventative strategies and avoidance of triggers. Also Pepcid as needed. Don't eat late at night.  6. Vitamin D deficiency Low Vitamin D level contributes to fatigue and are associated with obesity, breast, and colon cancer. She agrees to continue OTC Vitamin D 1,000 IU daily and will follow-up for routine testing of Vitamin D, at least 2-3 times per year to avoid over-replacement.  7. Other depression-emotional eating Pt declines counseling referral at this time. Will consider referral in the future as needed.  8. Depression screening Amyiah had a positive depression screening. Depression is commonly associated with obesity and often results in emotional eating behaviors. We will monitor this closely and work on CBT to help improve the non-hunger eating patterns. Referral to Psychology may be  required if no improvement is seen as she continues in our clinic.  9. Class 3 severe obesity without serious comorbidity with body mass index (BMI) of 45.0 to 49.9 in adult, unspecified obesity type (HCC)  Lab/Orders today or future: - CBC with Differential/Platelet - Comprehensive metabolic panel - Lipid Panel With LDL/HDL Ratio - Hemoglobin A1c  Jenne is currently in the action stage of change and her goal is to continue with weight loss efforts. I recommend Eliyah begin the structured treatment plan as follows:  She has agreed to the Category 1 Plan with Ensure Max Protein drink at breakfast daily..  Exercise goals:  As is    Behavioral modification strategies: increasing lean protein intake, decreasing simple carbohydrates, and no skipping meals.  She was informed of the importance of frequent follow-up visits to maximize her success with intensive lifestyle modifications for her multiple health conditions. She was informed we would discuss her lab results at her next visit unless there is a critical issue that needs to be addressed sooner. Shanica agreed to keep her next visit at the agreed upon time to discuss these results.  Objective:   Blood pressure 113/77, pulse 69, temperature 98.2 F (36.8 C), height 5\' 4"  (1.626 m), weight 264 lb (119.7 kg), SpO2 98 %. Body mass index is 45.32 kg/m.  EKG: Normal  sinus rhythm, rate 68.  Indirect Calorimeter completed today shows a VO2 of 166 and a REE of 1138.  Her calculated basal metabolic rate is 0998 thus her basal metabolic rate is worse than expected.  General: Cooperative, alert, well developed, in no acute distress. HEENT: Conjunctivae and lids unremarkable. Cardiovascular: Regular rhythm.  Lungs: Normal work of breathing. Neurologic: No focal deficits.   Lab Results  Component Value Date   CREATININE 0.62 02/24/2022   BUN 22 02/24/2022   NA 142 02/24/2022   K 4.8 02/24/2022   CL 102 02/24/2022   CO2 24 02/24/2022    Lab Results  Component Value Date   ALT 26 02/24/2022   AST 30 02/24/2022   ALKPHOS 55 02/24/2022   BILITOT 0.4 02/24/2022   Lab Results  Component Value Date   HGBA1C 5.5 02/24/2022   Lab Results  Component Value Date   INSULIN 9.6 02/24/2022   Lab Results  Component Value Date   TSH 1.220 02/24/2022   Lab Results  Component Value Date   CHOL 149 02/24/2022   HDL 52 02/24/2022   LDLCALC 74 02/24/2022   TRIG 130 02/24/2022   Lab Results  Component Value Date   WBC 5.9 02/24/2022   HGB 14.3 02/24/2022   HCT 42.7 02/24/2022   MCV 95 02/24/2022   PLT 232 02/24/2022    Attestation Statements:   Reviewed by clinician on day of visit: allergies, medications, problem list, medical history, surgical history, family history, social history, and previous encounter notes.  I, Kyung Rudd, BS, CMA, am acting as transcriptionist for [employer], DO.   I have reviewed the above documentation for accuracy and completeness, and I agree with the above. Carlye Grippe, D.O.  The 21st Century Cures Act was signed into law in 2016 which includes the topic of electronic health records.  This provides immediate access to information in MyChart.  This includes consultation notes, operative notes, office notes, lab results and pathology reports.  If you have any questions about what you read please let us know at your next visit so we can discuss your concerns and take corrective action if need be.  We are right here with you.

## 2022-03-19 DIAGNOSIS — M47896 Other spondylosis, lumbar region: Secondary | ICD-10-CM | POA: Diagnosis not present

## 2022-03-19 DIAGNOSIS — M545 Low back pain, unspecified: Secondary | ICD-10-CM | POA: Diagnosis not present

## 2022-03-19 DIAGNOSIS — G629 Polyneuropathy, unspecified: Secondary | ICD-10-CM | POA: Diagnosis not present

## 2022-03-24 DIAGNOSIS — M1712 Unilateral primary osteoarthritis, left knee: Secondary | ICD-10-CM | POA: Diagnosis not present

## 2022-03-24 DIAGNOSIS — M25562 Pain in left knee: Secondary | ICD-10-CM | POA: Diagnosis not present

## 2022-03-31 ENCOUNTER — Encounter (INDEPENDENT_AMBULATORY_CARE_PROVIDER_SITE_OTHER): Payer: Self-pay | Admitting: Internal Medicine

## 2022-03-31 ENCOUNTER — Ambulatory Visit (INDEPENDENT_AMBULATORY_CARE_PROVIDER_SITE_OTHER): Payer: PPO | Admitting: Internal Medicine

## 2022-03-31 VITALS — BP 130/77 | HR 67 | Temp 97.7°F | Ht 64.0 in | Wt 252.0 lb

## 2022-03-31 DIAGNOSIS — R7303 Prediabetes: Secondary | ICD-10-CM | POA: Diagnosis not present

## 2022-03-31 DIAGNOSIS — E559 Vitamin D deficiency, unspecified: Secondary | ICD-10-CM | POA: Diagnosis not present

## 2022-03-31 DIAGNOSIS — Z6841 Body Mass Index (BMI) 40.0 and over, adult: Secondary | ICD-10-CM | POA: Diagnosis not present

## 2022-03-31 DIAGNOSIS — E669 Obesity, unspecified: Secondary | ICD-10-CM

## 2022-03-31 NOTE — Progress Notes (Signed)
Chief Complaint:   OBESITY Tammy [name] is here to discuss her progress with her obesity treatment plan along with follow-up of her obesity related diagnoses. Tammy [name] is on the Category 1 Plan and states she is following her eating plan approximately 95% of the time. Tammy [name] states she is doing chair exercises for 10 minutes 7 times per week.  Today's visit was #: 3 Starting weight: 264 lbs Starting date: 02/24/2022 Today's weight: 252 lbs Today's date: 03/31/2022 Total lbs lost to date: 12 Total lbs lost since last in-office visit: 7  Interim History: This is my second encounter with Tammy [name].  She is following her nutritional plan with good adherence.  She notes adequate satiety and satiation.  She denies abnormal cravings.  She has been performing chair exercises.  She feels full and therefore has been using 2 ounce protein option.  She is watching her portions and plans her meals around protein is the main ingredient.  Her BIA shows a reduction in fat mass but she has also lost some muscle mass in the process.  Her meal plan has been reduced from a category 2 to a category 1.  Subjective:   1. Prediabetes Tammy [name] is prediabetic by history.  Her recent A1c, insulin, and fasting blood glucose have improved.  Lipid panel within acceptable range.  I discussed labs with the patient today.  2. Vitamin D deficiency Tammy [name]'s last vitamin D level was 26.8, associated with adiposity, leptin resistance, and fatigue.  I discussed labs with the patient today.  Assessment/Plan:   1. Prediabetes [place] will continue with her weight loss therapy, and she will increase the duration of her chair exercises.  2. Vitamin D deficiency Tammy [name] will continue vitamin D supplementation.  Goal level is 50-60.  We will recheck vitamin D level in 3 to 4 months.  3. Obesity, current BMI 43.4 Tammy [name] is currently in the action stage of change. As such, her goal is to continue with weight loss efforts.  She has agreed to the Category 1 Plan.   Measure and ensure adequate protein.  If further muscle loss will increase calorie allowance to 1200-1300 range.  Exercise goals: Increase duration of chair exercise by 5 minutes.  Behavioral modification strategies: increasing lean protein intake, increasing water intake, no skipping meals, meal planning and cooking strategies, avoiding temptations, and planning for success.  Tammy [name] has agreed to follow-up with our clinic in 4 weeks. She was informed of the importance of frequent follow-up visits to maximize her success with intensive lifestyle modifications for her multiple health conditions.   Objective:   Blood pressure 130/77, pulse 67, temperature 97.7 F (36.5 C), height 5\' 4"  (1.626 m), weight 252 lb (114.3 kg), SpO2 98 %. Body mass index is 43.26 kg/m.  General: Cooperative, alert, well developed, in no acute distress. HEENT: Conjunctivae and lids unremarkable. Cardiovascular: Regular rhythm.  Lungs: Normal work of breathing. Neurologic: No focal deficits.   Lab Results  Component Value Date   CREATININE 0.62 02/24/2022   BUN 22 02/24/2022   NA 142 02/24/2022   K 4.8 02/24/2022   CL 102 02/24/2022   CO2 24 02/24/2022   Lab Results  Component Value Date   ALT 26 02/24/2022   AST 30 02/24/2022   ALKPHOS 55 02/24/2022   BILITOT 0.4 02/24/2022   Lab Results  Component Value Date   HGBA1C 5.5 02/24/2022   Lab Results  Component Value Date   INSULIN 9.6 02/24/2022   Lab Results  Component  Value Date   TSH 1.220 02/24/2022   Lab Results  Component Value Date   CHOL 149 02/24/2022   HDL 52 02/24/2022   LDLCALC 74 02/24/2022   TRIG 130 02/24/2022   Lab Results  Component Value Date   VD25OH 26.8 (L) 02/24/2022   Lab Results  Component Value Date   WBC 5.9 02/24/2022   HGB 14.3 02/24/2022   HCT 42.7 02/24/2022   MCV 95 02/24/2022   PLT 232 02/24/2022   No results found for: "IRON", "TIBC",  "FERRITIN"  Attestation Statements:   Reviewed by clinician on day of visit: allergies, medications, problem list, medical history, surgical history, family history, social history, and previous encounter notes.  Time spent on visit including pre-visit chart review and post-visit care and charting was 20 minutes.   Trude Mcburney, am acting as transcriptionist for Worthy Rancher, MD.  I have reviewed the above documentation for accuracy and completeness, and I agree with the above. -Worthy Rancher, MD

## 2022-04-08 DIAGNOSIS — U071 COVID-19: Secondary | ICD-10-CM | POA: Diagnosis not present

## 2022-04-08 DIAGNOSIS — R059 Cough, unspecified: Secondary | ICD-10-CM | POA: Diagnosis not present

## 2022-04-08 DIAGNOSIS — R509 Fever, unspecified: Secondary | ICD-10-CM | POA: Diagnosis not present

## 2022-04-08 DIAGNOSIS — R5383 Other fatigue: Secondary | ICD-10-CM | POA: Diagnosis not present

## 2022-04-08 NOTE — Progress Notes (Signed)
Chief Complaint:   OBESITY Tammy [name] is here to discuss her progress with her obesity treatment plan along with follow-up of her obesity related diagnoses. [name] is on the Category 1 Plan and states she is following her eating plan approximately 100% of the time. [name] states she is doing chair exercises 10 minutes 7 times per week.  Today's visit was #: 2 Starting weight: 264 lbs Starting date: 02/24/2022 Today's weight: 259 lbs Today's date: 03/10/2022 Total lbs lost to date: 5 Total lbs lost since last in-office visit: 5 lbs  Interim History: [name] is here today for her first follow-up office visit since starting the program with [place].  All blood work/ lab tests that were recently ordered by myself or an outside provider were reviewed with patient today per their request.   Extended time was spent counseling her on all new disease processes that were discovered or preexisting ones that are affected by BMI.  she understands that many of these abnormalities will need to monitored regularly along with the current treatment plan of prudent dietary changes, in which we are making each and every office visit, to improve these health parameters.  Patient not able to eat all her foods.  Eats 4 ounces at dinner, cannot eat more due to fullness.  She denies hunger or craving.  Snacks 100-calorie popcorn [place].  She has decreased 5.4 lbs and fat mass is up a little in muscle mass.  She drinks 40 ounces of water, and Gatorade zero.  Subjective:   1. Pre-diabetes Labs discussed with patient today. Unknown first diagnosis.  Patient denies medications, cravings, or hunger.  A1c at goal at 5.5, fasting insulin 9.6, ABN, CMP, essentially within normal limits, glucose 91.  2. Osteoarthritis of left knee, unspecified osteoarthritis type Discussed labs with patient today. Patient is on Celebrex as needed for knee pain.  She sees Dr. [facility]ena [facility]dis at [facility] for management.  3.  Gastroesophageal reflux disease, unspecified whether esophagitis present Labs discussed with patient today. Patient denies any increased symptoms.  Only takes Pepcid AC OTC as needed.  B12 within normal limits.  4. Vitamin D deficiency Worsening.  Labs discussed with patient today. Patient currently takes 1000 IU OTC daily of vitamin D for many months.  Vitamin D is worse at 26.8, and not at goal.  Patient was checked 6 months ago and was told that it was within normal limits.  Assessment/Plan:  No orders of the defined types were placed in this encounter.   Medications Discontinued During This Encounter  Medication Reason   cholecalciferol (VITAMIN D) 1000 units tablet      Meds ordered this encounter  Medications   Cholecalciferol (VITAMIN D3) 125 MCG (5000 UT) TABS    Sig: 5000 IU daily and 10,000 IU Fri, Sat and Sun    Dispense:  30 tablet     1. Pre-diabetes Counseling done on how foods affect her blood sugars, hunger, and cravings.  Handouts given on insulin resistance or prediabetes given after extensive discussion had with patient.  Continue PNP and weight loss plan.  Recheck labs every 3 to 6 months.  2. Osteoarthritis of left knee, unspecified osteoarthritis type CBC, and CMP within normal limits.  Patient encouraged to follow-up with Ortho.  3. Gastroesophageal reflux disease, unspecified whether esophagitis present CMP within normal limits.  Continue PNP and weight loss.  Continue Pepcid as needed.  4. Vitamin D deficiency Vitamin D not at goal.  Increase to 5000 IU OTC  Monday through Thursday, and Friday, Saturday and Sunday.  Patient declines prescription for prescription ergocalciferol, prefers OTC.  Recheck in 3 to 4 months.  Bone density exam recommended every 2 years.  Patient unsure of when last checked.  Increase- Cholecalciferol (VITAMIN D3) 125 MCG (5000 UT) TABS; 5000 IU daily and 10,000 IU Fri, Sat and Sun  Dispense: 30 tablet  5. Obesity with current BMI  of 44.5 Protein equivalents handout given.  Tally is currently in the action stage of change. As such, her goal is to continue with weight loss efforts. She has agreed to the Category 1 Plan. She may add Ensure max protein at breakfast as a protein equivalent.   Exercise goals:  As is.  Behavioral modification strategies: increasing lean protein intake, decreasing simple carbohydrates, decreasing sodium intake, and meal planning and cooking strategies.  [name] has agreed to follow-up with our clinic in 2 weeks. She was informed of the importance of frequent follow-up visits to maximize her success with intensive lifestyle modifications for her multiple health conditions.   Objective:   Blood pressure 119/78, pulse 69, temperature 98.8 F (37.1 C), height 5\' 4"  (1.626 m), weight 259 lb (117.5 kg), SpO2 98 %. Body mass index is 44.46 kg/m.  General: Cooperative, alert, well developed, in no acute distress. HEENT: Conjunctivae and lids unremarkable. Cardiovascular: Regular rhythm.  Lungs: Normal work of breathing. Neurologic: No focal deficits.   Lab Results  Component Value Date   CREATININE 0.62 02/24/2022   BUN 22 02/24/2022   NA 142 02/24/2022   K 4.8 02/24/2022   CL 102 02/24/2022   CO2 24 02/24/2022   Lab Results  Component Value Date   ALT 26 02/24/2022   AST 30 02/24/2022   ALKPHOS 55 02/24/2022   BILITOT 0.4 02/24/2022   Lab Results  Component Value Date   HGBA1C 5.5 02/24/2022   Lab Results  Component Value Date   INSULIN 9.6 02/24/2022   Lab Results  Component Value Date   TSH 1.220 02/24/2022   Lab Results  Component Value Date   CHOL 149 02/24/2022   HDL 52 02/24/2022   LDLCALC 74 02/24/2022   TRIG 130 02/24/2022   Lab Results  Component Value Date   VD25OH 26.8 (L) 02/24/2022   Lab Results  Component Value Date   WBC 5.9 02/24/2022   HGB 14.3 02/24/2022   HCT 42.7 02/24/2022   MCV 95 02/24/2022   PLT 232 02/24/2022   No results found  for: "IRON", "TIBC", "FERRITIN"  Attestation Statements:   Reviewed by clinician on day of visit: allergies, medications, problem list, medical history, surgical history, family history, social history, and previous encounter notes.  Time spent on visit including pre-visit chart review and post-visit care and charting was 50 minutes.   I, 13/10/2021, RMA, am acting as [employer] for [employer], DO.   I have reviewed the above documentation for accuracy and completeness, and I agree with the above. [employer], D.O.  The 21st Century Cures Act was signed into law in 2016 which includes the topic of electronic health records.  This provides immediate access to information in MyChart.  This includes consultation notes, operative notes, office notes, lab results and pathology reports.  If you have any questions about what you read please let 2017 know at your next visit so we can discuss your concerns and take corrective action if need be.  We are right here with you.

## 2022-04-23 DIAGNOSIS — M1712 Unilateral primary osteoarthritis, left knee: Secondary | ICD-10-CM | POA: Diagnosis not present

## 2022-04-23 DIAGNOSIS — M25562 Pain in left knee: Secondary | ICD-10-CM | POA: Diagnosis not present

## 2022-04-28 ENCOUNTER — Ambulatory Visit (INDEPENDENT_AMBULATORY_CARE_PROVIDER_SITE_OTHER): Payer: PPO | Admitting: Internal Medicine

## 2022-04-28 ENCOUNTER — Encounter (INDEPENDENT_AMBULATORY_CARE_PROVIDER_SITE_OTHER): Payer: Self-pay | Admitting: Internal Medicine

## 2022-04-28 VITALS — BP 130/79 | Temp 98.8°F | Ht 64.0 in | Wt 244.0 lb

## 2022-04-28 DIAGNOSIS — M1712 Unilateral primary osteoarthritis, left knee: Secondary | ICD-10-CM | POA: Diagnosis not present

## 2022-04-28 DIAGNOSIS — Z6841 Body Mass Index (BMI) 40.0 and over, adult: Secondary | ICD-10-CM

## 2022-04-28 DIAGNOSIS — E669 Obesity, unspecified: Secondary | ICD-10-CM

## 2022-04-28 DIAGNOSIS — R7303 Prediabetes: Secondary | ICD-10-CM

## 2022-04-28 DIAGNOSIS — E559 Vitamin D deficiency, unspecified: Secondary | ICD-10-CM

## 2022-04-28 NOTE — Progress Notes (Signed)
Chief Complaint:   OBESITY Tammy [name] is here to discuss her progress with her obesity treatment plan along with follow-up of her obesity related diagnoses. Tammy [name] is on the Category 1 Plan and states she is following her eating plan approximately 95% of the time. Tammy [name] states she is doing physical therapy for 30 minutes 2 times per week, and chair exercise for 10-15 minutes 5 times per week.  Today's visit was #: 4 Starting weight: 264 lbs Starting date: 02/24/2022 Today's weight: 244 lbs Today's date: 04/28/2022 Total lbs lost to date: 20 Total lbs lost since last in-office visit: 8  Interim History: Tammy [name] presents today for follow-up.  Since last office visit she has lost 8 pounds.  She reports excellent adherence to meal plan with good satiety and satiation.  She denies any abnormal cravings problems with portions or consumption of liquid calories.  She has started physical therapy for her left knee and continues to do chair exercises at home.  She is really excited about ongoing weight loss as she is looking forward to having left total knee replacement.  BIA shows a reduction in body fat from 59% to 56%.  Her visceral fat rating has also gone down from 22-19.  Muscle mass this time around has increased.  Pharmacotherapy: None.  Subjective:   1. Vitamin D deficiency Vitamin D levels were 26.8 back in November.  This is associated with adiposity and may result in leptin resistance.  She is on high-dose supplementation without any side effects.  2. Prediabetes Her last A1c was 5.5 in November.  She had relatively normal insulin levels and fasting blood sugar.  She is asymptomatic.  3. Primary osteoarthritis of left knee Patient is undergoing physical therapy with some improvement in her left knee pain also as a result of her weight loss.    Assessment/Plan:   1. Vitamin D deficiency Continue vitamin D supplementation check levels in February to avoid  oversupplementation.  2. Prediabetes She will continue with weight loss therapy.  No need for pharmacoprevention at present time.  3. Primary osteoarthritis of left knee She would likely be having knee replacement in the future once she reach goal BMI of 41.  4. Obesity, current BMI 42 Tammy [name] is currently in the action stage of change. As such, her goal is to continue with weight loss efforts. She has agreed to the Category 3 Plan.   Exercise goals: As is.   Behavioral modification strategies: no skipping meals, keeping healthy foods in the home, emotional eating strategies, avoiding temptations, and planning for success.  Tammy [name] has agreed to follow-up with our clinic in 3 weeks. She was informed of the importance of frequent follow-up visits to maximize her success with intensive lifestyle modifications for her multiple health conditions.   Objective:   Blood pressure 130/79, temperature 98.8 F (37.1 C), height 5\' 4"  (1.626 m), weight 244 lb (110.7 kg). Body mass index is 41.88 kg/m.  General: Cooperative, alert, well developed, in no acute distress. HEENT: Conjunctivae and lids unremarkable. Cardiovascular: Regular rhythm.  Lungs: Normal work of breathing. Neurologic: No focal deficits.   Lab Results  Component Value Date   CREATININE 0.62 02/24/2022   BUN 22 02/24/2022   NA 142 02/24/2022   K 4.8 02/24/2022   CL 102 02/24/2022   CO2 24 02/24/2022   Lab Results  Component Value Date   ALT 26 02/24/2022   AST 30 02/24/2022   ALKPHOS 55 02/24/2022   BILITOT 0.4 02/24/2022  Lab Results  Component Value Date   HGBA1C 5.5 02/24/2022   Lab Results  Component Value Date   INSULIN 9.6 02/24/2022   Lab Results  Component Value Date   TSH 1.220 02/24/2022   Lab Results  Component Value Date   CHOL 149 02/24/2022   HDL 52 02/24/2022   LDLCALC 74 02/24/2022   TRIG 130 02/24/2022   Lab Results  Component Value Date   VD25OH 26.8 (L) 02/24/2022   Lab  Results  Component Value Date   WBC 5.9 02/24/2022   HGB 14.3 02/24/2022   HCT 42.7 02/24/2022   MCV 95 02/24/2022   PLT 232 02/24/2022   No results found for: "IRON", "TIBC", "FERRITIN"  Attestation Statements:   Reviewed by clinician on day of visit: allergies, medications, problem list, medical history, surgical history, family history, social history, and previous encounter notes.   Wilhemena Durie, am acting as transcriptionist for Thomes Dinning, MD.  I have reviewed the above documentation for accuracy and completeness, and I agree with the above. -Thomes Dinning, MD

## 2022-04-30 DIAGNOSIS — M1712 Unilateral primary osteoarthritis, left knee: Secondary | ICD-10-CM | POA: Diagnosis not present

## 2022-04-30 DIAGNOSIS — M25562 Pain in left knee: Secondary | ICD-10-CM | POA: Diagnosis not present

## 2022-05-04 DIAGNOSIS — M1712 Unilateral primary osteoarthritis, left knee: Secondary | ICD-10-CM | POA: Diagnosis not present

## 2022-05-07 ENCOUNTER — Ambulatory Visit: Payer: PPO | Admitting: Neurology

## 2022-05-07 ENCOUNTER — Encounter: Payer: Self-pay | Admitting: Neurology

## 2022-05-07 VITALS — BP 110/62 | HR 67 | Ht 64.0 in | Wt 250.5 lb

## 2022-05-07 DIAGNOSIS — M25562 Pain in left knee: Secondary | ICD-10-CM | POA: Diagnosis not present

## 2022-05-07 DIAGNOSIS — R200 Anesthesia of skin: Secondary | ICD-10-CM | POA: Diagnosis not present

## 2022-05-07 DIAGNOSIS — M1712 Unilateral primary osteoarthritis, left knee: Secondary | ICD-10-CM | POA: Diagnosis not present

## 2022-05-07 DIAGNOSIS — G5701 Lesion of sciatic nerve, right lower limb: Secondary | ICD-10-CM

## 2022-05-07 NOTE — Progress Notes (Signed)
[facility]  PATIENT: Tammy [name] DOB: October 29, 1955  REFERRING DOCTOR OR PCP:    Ceasar Lund, MD SOURCE: Patient, note from primary care, laboratory reports  _________________________________   HISTORICAL  CHIEF COMPLAINT:  Chief Complaint  Patient presents with   New Patient (Initial Visit)    Patient in room 11, here for polyneuropathy, right foot goes numbness when driving only. Starts in pinky toe and moves to other toes the longer she drives. Reports this started in Oct or Nov. Scheduled for left knee replacement in April.    HISTORY OF PRESENT ILLNESS:  I had the pleasure of seeing your patient, Tammy [name], at [facility] for neurologic consultation regarding her right foot numbness..  She is a 67 year old woman who notes numbness in the right leg, only when driving.   Numbness starts in the little toe and spreads to other toes the longer she drives.   The numbness does not go above the ankle.  The left foot is never involved.  She denies dysesthesias or pain.    When numb, she feels a little weaker in the right foot.  If she tries to walk when the numbness is more severe she is concerned about tripping.  After she starts to move around the numbness begins to improve and around an hour she is back to baseline.  She is going to have a left TKR .  She has a wheelchair she uses when her right foot is numb.      Labs 02/24/2022:  B12, TSH, HgbA1c were normal .  Vit D mildly reduced at 26  She does not have diabetes.        REVIEW OF SYSTEMS: Constitutional: No fevers, chills, sweats, or change in appetite Eyes: No visual changes, double vision, eye pain Ear, nose and throat: No hearing loss, ear pain, nasal congestion, sore throat Cardiovascular: No chest pain, palpitations Respiratory:  No shortness of breath at rest or with exertion.   No wheezes GastrointestinaI: No nausea, vomiting, diarrhea, abdominal pain, fecal  incontinence Genitourinary:  No dysuria, urinary retention or frequency.  No nocturia. Musculoskeletal: She has left knee pain and will be getting a total knee replacement.   Integumentary: No rash, pruritus, skin lesions Neurological: as above Psychiatric: No depression at this time.  No anxiety Endocrine: No palpitations, diaphoresis, change in appetite, change in weigh or increased thirst Hematologic/Lymphatic:  No anemia, purpura, petechiae. Allergic/Immunologic: No itchy/runny eyes, nasal congestion, recent allergic reactions, rashes  ALLERGIES: Allergies  Allergen Reactions   Benadryl [Diphenhydramine] Other (See Comments)    Reports it makes her fall asleep.   Budesonide    Prednisone    Propoxyphene    Amoxicillin Rash   Carisprodol [Carisoprodol] Itching and Rash   Doxycycline Itching and Rash   Penicillins Rash    Has patient had a PCN reaction causing immediate rash, facial/tongue/throat swelling, SOB or lightheadedness with hypotension: yes Has patient had a PCN reaction causing severe rash involving mucus membranes or skin necrosis: no Has patient had a PCN reaction that required hospitalization: no Has patient had a PCN reaction occurring within the last 10 years: yes If all of the above answers are "NO", then may proceed with Cephalosporin use.     HOME MEDICATIONS:  Current Outpatient Medications:    acetaminophen (TYLENOL) 500 MG tablet, Take 500 mg by mouth 2 (two) times daily as needed for mild pain or headache. , Disp: , Rfl:    Ascorbic Acid (  VITAMIN C) 1000 MG tablet, Take 1,000 mg by mouth daily., Disp: , Rfl:    celecoxib (CELEBREX) 200 MG capsule, Take 200 mg by mouth daily., Disp: , Rfl:    Cholecalciferol (VITAMIN D3) 125 MCG (5000 UT) TABS, 5000 IU daily and 10,000 IU Fri, Sat and Sun, Disp: 30 tablet, Rfl:    famotidine (PEPCID) 20 MG tablet, Take 1 tablet (20 mg total) by mouth 2 (two) times daily., Disp: , Rfl:    loperamide (IMODIUM A-D) 2 MG  tablet, Take 2 mg by mouth 4 (four) times daily as needed for diarrhea or loose stools., Disp: , Rfl:    loratadine (CLARITIN) 10 MG tablet, Take 10 mg by mouth every morning., Disp: , Rfl:    Multiple Vitamins-Minerals (MULTIVITAMIN PO), Take 1 tablet by mouth every morning. Women's Centrum Silver, Disp: , Rfl:   PAST MEDICAL HISTORY: Past Medical History:  Diagnosis Date   Allergic rhinitis, cause unspecified    Back pain    Diaphragmatic hernia without mention of obstruction or gangrene    GERD (gastroesophageal reflux disease)    IBS (irritable bowel syndrome)    Lymphocytic colitis    Osteoarthritis    Other and unspecified ovarian cyst    Other disorder of menstruation and other abnormal bleeding from female genital tract    Plantar fascial fibromatosis    Prediabetes    Schatzki's ring    Stomach ulcer    Stricture and stenosis of esophagus    Vitamin D deficiency     PAST SURGICAL HISTORY: Past Surgical History:  Procedure Laterality Date   BACK SURGERY     lumbar   ESOPHAGOGASTRODUODENOSCOPY (EGD) WITH PROPOFOL N/A 01/29/2014   Procedure: ESOPHAGOGASTRODUODENOSCOPY (EGD) WITH PROPOFOL;  Surgeon: Inda Castle, MD;  Location: [facility];  Service: Endoscopy;  Laterality: N/A;   KNEE SURGERY Right 02/2012   trorn meniscus repair   TONSILLECTOMY     TOTAL KNEE ARTHROPLASTY Right 10/22/2017   Procedure: RIGHT TOTAL KNEE ARTHROPLASTY;  Surgeon: Sydnee Cabal, MD;  Location: [facility];  Service: Orthopedics;  Laterality: Right;    FAMILY HISTORY: Family History  Problem Relation Age of Onset   Hypertension Mother    Stroke Mother    Obesity Mother    Colon cancer Neg Hx    Esophageal cancer Neg Hx    Rectal cancer Neg Hx    Stomach cancer Neg Hx     SOCIAL HISTORY: Social History   Socioeconomic History   Marital status: Married    Spouse name: Not on file   Number of children: 2   Years of education: Not on file   Highest education level: Not on file   Occupational History   Occupation: [employer]   Occupation: [employer] Animal nutritionist: [employer]  Tobacco Use   Smoking status: Never   Smokeless tobacco: Never  Vaping Use   Vaping Use: Never used  Substance and Sexual Activity   Alcohol use: No   Drug use: No   Sexual activity: Not on file  Other Topics Concern   Not on file  Social History Narrative   Patient is right handed.   Does not drink caffeine.    Social Determinants of Health   Financial Resource Strain: Not on file  Food Insecurity: Not on file  Transportation Needs: Not on file  Physical Activity: Not on file  Stress: Not on file  Social Connections: Not on file  Intimate Partner Violence: Not on file  PHYSICAL EXAM  Vitals:   05/07/22 1030  BP: 110/62  Pulse: 67  Weight: 250 lb 8 oz (113.6 kg)  Height: 5\' 4"  (1.626 m)    Body mass index is 43 kg/m.   General: The patient is well-developed and well-nourished and in no acute distress  HEENT:  Head is /AT.  Sclera are anicteric.    Neck: No carotid bruits are noted.  The neck is nontender.  Cardiovascular: The heart has a regular rate and rhythm with a normal S1 and S2. There were no murmurs, gallops or rubs.    Skin: Extremities are without rash or  edema.  Musculoskeletal:  Back is nontender  Neurologic Exam  Mental status: The patient is alert and oriented x 3 at the time of the examination. The patient has apparent normal recent and remote memory, with an apparently normal attention span and concentration ability.   Speech is normal.  Cranial nerves: Extraocular movements are full. Pupils are equal, round, and reactive to light and accomodation.  Facial strength and sensation was normal.   No obvious hearing deficits are noted.  Motor:  Muscle bulk is normal.   Tone is normal. Strength is  5 / 5 in all 4 extremities.   Sensory: Sensory testing is intact to pinprick, soft touch and vibration sensation in all 4  extremities including the feet there is no asymmetry and vibration or pinprick sensation  Coordination: Cerebellar testing reveals good finger-nose-finger and heel-to-shin bilaterally.  Gait and station: Station is normal.   Gait is mildly arthritic. Tandem gait is mildly wide. Romberg is negative.   Reflexes: Deep tendon reflexes are symmetric and normal bilaterally.   Plantar responses are flexor.    DIAGNOSTIC DATA (LABS, IMAGING, TESTING) - I reviewed patient records, labs, notes, testing and imaging myself where available.  Lab Results  Component Value Date   WBC 5.9 02/24/2022   HGB 14.3 02/24/2022   HCT 42.7 02/24/2022   MCV 95 02/24/2022   PLT 232 02/24/2022      Component Value Date/Time   NA 142 02/24/2022 1132   K 4.8 02/24/2022 1132   CL 102 02/24/2022 1132   CO2 24 02/24/2022 1132   GLUCOSE 91 02/24/2022 1132   GLUCOSE 89 12/27/2019 1029   BUN 22 02/24/2022 1132   CREATININE 0.62 02/24/2022 1132   CALCIUM 9.7 02/24/2022 1132   PROT 6.2 02/24/2022 1132   ALBUMIN 4.0 02/24/2022 1132   AST 30 02/24/2022 1132   ALT 26 02/24/2022 1132   ALKPHOS 55 02/24/2022 1132   BILITOT 0.4 02/24/2022 1132   GFRNONAA >60 10/23/2017 0514   GFRAA >60 10/23/2017 0514   Lab Results  Component Value Date   CHOL 149 02/24/2022   HDL 52 02/24/2022   LDLCALC 74 02/24/2022   TRIG 130 02/24/2022   Lab Results  Component Value Date   HGBA1C 5.5 02/24/2022   Lab Results  Component Value Date   GQQPYPPJ09 326 02/24/2022   Lab Results  Component Value Date   TSH 1.220 02/24/2022       ASSESSMENT AND PLAN  Numbness  Osteoarthritis of left knee, unspecified osteoarthritis type  Compression of right sciatic nerve   In summary, Ms. Cartaya is a 67 year old woman with transient positional numbness in the right foot that occurs only when she drives longer distances and resolves when she moves around.  The symptoms are most consistent with transient compression of the  right sciatic nerve in the posterior thigh/buttock.  This appears  to be due to her positioning while she drives in her car.  No specific medical treatment or further diagnostic testing is necessary at this time.  I did advise her to try a couple different things including changing the seat position so that there is less pressure on the back of her thigh.  Alternatively, she could try a Gelfoam seat cushion.  If neither of these helped, I recommend that she walk around for about 5 minutes when symptoms begin before resuming driving.  I did not schedule follow-up but can see her back if she has a significant change in her symptoms.  Thank you for asking me to see Ms. Ingram.  Please let me know if I can be of further assistance with her or other patients in the future.     Denetria Luevanos A. Felecia Shelling, MD, [facility] [id number], [id number] AM Certified in Neurology, Clinical Neurophysiology, Sleep Medicine and Neuroimaging  [facility] [address] [place], [place] [address] [phone]

## 2022-05-12 ENCOUNTER — Encounter (INDEPENDENT_AMBULATORY_CARE_PROVIDER_SITE_OTHER): Payer: Self-pay | Admitting: *Deleted

## 2022-05-13 ENCOUNTER — Ambulatory Visit (INDEPENDENT_AMBULATORY_CARE_PROVIDER_SITE_OTHER): Payer: PPO | Admitting: Internal Medicine

## 2022-05-19 ENCOUNTER — Ambulatory Visit (INDEPENDENT_AMBULATORY_CARE_PROVIDER_SITE_OTHER): Payer: PPO | Admitting: Internal Medicine

## 2022-05-19 ENCOUNTER — Encounter (INDEPENDENT_AMBULATORY_CARE_PROVIDER_SITE_OTHER): Payer: Self-pay | Admitting: Internal Medicine

## 2022-05-19 VITALS — BP 114/72 | HR 65 | Temp 98.0°F | Ht 64.0 in | Wt 241.0 lb

## 2022-05-19 DIAGNOSIS — E669 Obesity, unspecified: Secondary | ICD-10-CM

## 2022-05-19 DIAGNOSIS — M1712 Unilateral primary osteoarthritis, left knee: Secondary | ICD-10-CM | POA: Diagnosis not present

## 2022-05-19 DIAGNOSIS — Z6841 Body Mass Index (BMI) 40.0 and over, adult: Secondary | ICD-10-CM

## 2022-05-19 DIAGNOSIS — E559 Vitamin D deficiency, unspecified: Secondary | ICD-10-CM

## 2022-05-19 NOTE — Progress Notes (Signed)
Office: [phone]  /  Fax: [facility] Height: 5\' 4"  (1.626 m) Weight: 241 lb (109.3 kg) Temp: 98 F (36.7 C) Pulse Rate: 65 BP: 114/72 SpO2: 97 % Fasting: No Labs: No Today's Visit #: 5 Weight at Last VIsit: 240 lb Weight Lost Since Last Visit: 1 lb  Body Fat %: 56.4 % Fat Mass (lbs): 136 lbs Muscle Mass (lbs): 99.6 lbs Visceral Fat Rating : 19 Starting Date: 02/03/22 Starting Weight: 272 lb Total Weight Loss (lbs): 21 lb (3.175 kg)    HPI  Chief Complaint: OBESITY  Tammy [name] is here to discuss her progress with her obesity treatment plan. She is on the the Category 1 Plan and states she is following her eating plan approximately 95 % of the time. She states she is exercising 10 minutes 7 times per week. She state she is currently doing chair exercises.    Interval History: Since last office she has lost 1 pound she is currently averaging about half a pound a week.  She reports adequate satiety and satiation.  She denies abnormal cravings.  She denies problems with eating patterns or consumption of liquid calories.  She has limited physical activity due to orthopedic problems but is trying to do some chair exercises.  She was recently seen by her orthopedic surgeon and he would like for her to lose an additional 10 pounds to move forward with joint surgery.  Thus far she has lost 23 pounds.  Pharmacotherapy: None  PHYSICAL EXAM:  Blood pressure 114/72, pulse 65, temperature 98 F (36.7 C), height 5\' 4"  (1.626 m), weight 241 lb (109.3 kg), SpO2 97 %. Body mass index is 41.37 kg/m.  General: She is overweight, cooperative, alert, well developed, and in no acute distress. PSYCH: Has normal mood, affect and thought process.   HEENT: EOMI, sclerae are anicteric. Lungs: Normal breathing effort, no conversational dyspnea. Extremities: No edema.  Neurologic: No gross sensory or motor deficits. No tremors or  fasciculations noted.    DIAGNOSTIC DATA REVIEWED:  BMET    Component Value Date/Time   NA 142 02/24/2022 1132   K 4.8 02/24/2022 1132   CL 102 02/24/2022 1132   CO2 24 02/24/2022 1132   GLUCOSE 91 02/24/2022 1132   GLUCOSE 89 12/27/2019 1029   BUN 22 02/24/2022 1132   CREATININE 0.62 02/24/2022 1132   CALCIUM 9.7 02/24/2022 1132   GFRNONAA >60 10/23/2017 0514   GFRAA >60 10/23/2017 0514   Lab Results  Component Value Date   HGBA1C 5.5 02/24/2022   Lab Results  Component Value Date   INSULIN 9.6 02/24/2022   CBC    Component Value Date/Time   WBC 5.9 02/24/2022 1132   WBC 5.1 12/27/2019 1029   RBC 4.52 02/24/2022 1132   RBC 4.25 12/27/2019 1029   HGB 14.3 02/24/2022 1132   HCT 42.7 02/24/2022 1132   PLT 232 02/24/2022 1132   MCV 95 02/24/2022 1132   MCH 31.6 02/24/2022 1132   MCH 30.7 10/25/2017 0522   MCHC 33.5 02/24/2022 1132   MCHC 33.2 12/27/2019 1029   RDW 13.5 02/24/2022 1132   Iron/TIBC/Ferritin/ %Sat No results found for: "IRON", "TIBC", "FERRITIN", "IRONPCTSAT" Lipid Panel     Component Value Date/Time   CHOL 149 02/24/2022 1132   TRIG 130 02/24/2022 1132   HDL 52 02/24/2022 1132   LDLCALC 74 02/24/2022 1132   Hepatic Function Panel     Component Value Date/Time  PROT 6.2 02/24/2022 1132   ALBUMIN 4.0 02/24/2022 1132   AST 30 02/24/2022 1132   ALT 26 02/24/2022 1132   ALKPHOS 55 02/24/2022 1132   BILITOT 0.4 02/24/2022 1132      Component Value Date/Time   TSH 1.220 02/24/2022 1132     ASSESSMENT AND PLAN  TREATMENT PLAN FOR OBESITY:  Recommended Dietary Goals  Tammy [name] is currently in the action stage of change. As such, her goal is to continue weight management plan. She has agreed to the Category 3 Plan.  Behavioral Intervention  We discussed the following Behavioral Modification Strategies today: increasing lean protein intake, increasing water intake, meal planning and cooking strategies, avoiding temptations, and planning  for success.  Additional resources provided today: NA  Recommended Physical Activity Goals  Tammy [name] has been advised to work up to 150 minutes of moderate intensity aerobic activity a week and strengthening exercises 2-3 times per week for cardiovascular health, weight loss maintenance and preservation of muscle mass.   She has agreed to increase physical activity in their day and reduce sedentary time (increase NEAT).    Pharmacotherapy We discussed various medication options to help [facility] with her weight loss efforts and we both agreed to continue with nutritional and behavioral approaches.  ASSOCIATED CONDITIONS ADDRESSED TODAY  Vitamin D deficiency Assessment & Plan: Most recent vitamin D levels  Lab Results  Component Value Date   VD25OH 26.8 (L) 02/24/2022   . Deficiency state associated with adiposity and may result in leptin resistance and weight gain. Also associated with fatigue.  Currently on vitamin D supplementation without any adverse effects.  Continue over-the-counter vitamin D currently taking 5000 units daily for 3 months  and check levels for a goal of 50-60 mg/dl.     Obesity, current BMI 42  Primary osteoarthritis of left knee Assessment & Plan: Affecting her mobility and quality of life.  She has lost a total of 24 pounds and has 10 more pounds to go for approval of her surgery.  Patient will continue to work on weight loss.       Return in about 2 weeks (around 06/02/2022) for For Weight Mangement with Dr. Gerarda Fraction.[name] She was informed of the importance of frequent follow up visits to maximize her success with intensive lifestyle modifications for her multiple health conditions.   ATTESTASTION STATEMENTS:  Reviewed by clinician on day of visit: allergies, medications, problem list, medical history, surgical history, family history, social history, and previous encounter notes.   Time spent on visit including pre-visit chart review and post-visit care  and charting was 30 minutes.    Thomes Dinning, MD

## 2022-05-19 NOTE — Assessment & Plan Note (Signed)
Affecting her mobility and quality of life.  She has lost a total of 24 pounds and has 10 more pounds to go for approval of her surgery.  Patient will continue to work on weight loss.

## 2022-05-19 NOTE — Assessment & Plan Note (Signed)
Most recent vitamin D levels  Lab Results  Component Value Date   VD25OH 26.8 (L) 02/24/2022   . Deficiency state associated with adiposity and may result in leptin resistance and weight gain. Also associated with fatigue.  Currently on vitamin D supplementation without any adverse effects.  Continue over-the-counter vitamin D currently taking 5000 units daily for 3 months  and check levels for a goal of 50-60 mg/dl.

## 2022-06-02 ENCOUNTER — Ambulatory Visit (INDEPENDENT_AMBULATORY_CARE_PROVIDER_SITE_OTHER): Payer: PPO | Admitting: Internal Medicine

## 2022-06-02 ENCOUNTER — Encounter (INDEPENDENT_AMBULATORY_CARE_PROVIDER_SITE_OTHER): Payer: Self-pay | Admitting: Internal Medicine

## 2022-06-02 VITALS — BP 130/78 | HR 62 | Temp 98.5°F | Ht 64.0 in | Wt 243.0 lb

## 2022-06-02 DIAGNOSIS — Z6841 Body Mass Index (BMI) 40.0 and over, adult: Secondary | ICD-10-CM | POA: Diagnosis not present

## 2022-06-02 DIAGNOSIS — R7303 Prediabetes: Secondary | ICD-10-CM

## 2022-06-02 DIAGNOSIS — E669 Obesity, unspecified: Secondary | ICD-10-CM | POA: Diagnosis not present

## 2022-06-02 MED ORDER — METFORMIN HCL ER 500 MG PO TB24: 500.0000 mg | ORAL_TABLET | Freq: Every day | ORAL | 0 refills | Status: DC

## 2022-06-02 NOTE — Assessment & Plan Note (Signed)
Most recent A1c is  Lab Results  Component Value Date   HGBA1C 5.5 02/24/2022   with associated elevated insulin levels. HOMA- IR is 2.19, optimal < 1.9.  Patient informed of disease state and risk of progression. This may contribute to abnormal cravings, fatigue and diabetes complications without having diabetes.   She will continue on reduced calorie nutrition plan low on simple sugars.  She will be started on metformin 500 mg 1 tablet daily to improve insulin resistance and assist with weight management.

## 2022-06-02 NOTE — Progress Notes (Signed)
Office: [phone]  /  Fax: [facility] Height: 5' 4"$  (1.626 m) Weight: 243 lb (110.2 kg) Temp: 98.5 F (36.9 C) Pulse Rate: 62 BP: 130/78 SpO2: 97 % Fasting: n Labs: n Today's Visit #: 6 Weight at Last VIsit: 241 lb Weight Lost Since Last Visit: +2  Body Fat %: 56.3 % Fat Mass (lbs): 137 lbs Muscle Mass (lbs): 101 lbs Visceral Fat Rating : 19 Peak Weight: 286 lb Starting Date: 02/03/22 Starting Weight: 264 lb Total Weight Loss (lbs): 21 lb (9.526 kg)    HPI  Chief Complaint: OBESITY  Tammy [name] is here to discuss her progress with her obesity treatment plan. She is on the the Category 1 Plan and states she is following her eating plan approximately 95 % of the time. She states she is exercising 10 minutes 3 times per week.   Interval History:  Since last office visit she has gained 2 pounds.  She feels a secondary to eating unhealthy yesterday while attending a [employer] Bowl party.  Her weight loss rate has been, decreasing to about half a pound per week.  Her 24-hour recall: Protein shake in the morning, rice bowl with beans about 20 g of protein for lunch, grilled salmon with vegetables for dinner.  She denies consumption of liquid calories.  She reports adequate satiety, satiation and denies abnormal cravings.  She has been working hard to lose weight in an attempt to qualify for joint replacement surgery.  She has 10 more pounds to go.  She is inquiring about medical therapy to assist her.   Pharmacotherapy: None  PHYSICAL EXAM:  Blood pressure 130/78, pulse 62, temperature 98.5 F (36.9 C), height 5' 4"$  (1.626 m), weight 243 lb (110.2 kg), SpO2 97 %. Body mass index is 41.71 kg/m.  General: She is overweight, cooperative, alert, well developed, and in no acute distress. PSYCH: Has normal mood, affect and thought process.   HEENT: EOMI, sclerae are anicteric. Lungs: Normal breathing effort, no conversational  dyspnea. Extremities: No edema.  Neurologic: No gross sensory or motor deficits. No tremors or fasciculations noted.    DIAGNOSTIC DATA REVIEWED:  BMET    Component Value Date/Time   NA 142 02/24/2022 1132   K 4.8 02/24/2022 1132   CL 102 02/24/2022 1132   CO2 24 02/24/2022 1132   GLUCOSE 91 02/24/2022 1132   GLUCOSE 89 12/27/2019 1029   BUN 22 02/24/2022 1132   CREATININE 0.62 02/24/2022 1132   CALCIUM 9.7 02/24/2022 1132   GFRNONAA >60 10/23/2017 0514   GFRAA >60 10/23/2017 0514   Lab Results  Component Value Date   HGBA1C 5.5 02/24/2022   Lab Results  Component Value Date   INSULIN 9.6 02/24/2022   Lab Results  Component Value Date   TSH 1.220 02/24/2022   CBC    Component Value Date/Time   WBC 5.9 02/24/2022 1132   WBC 5.1 12/27/2019 1029   RBC 4.52 02/24/2022 1132   RBC 4.25 12/27/2019 1029   HGB 14.3 02/24/2022 1132   HCT 42.7 02/24/2022 1132   PLT 232 02/24/2022 1132   MCV 95 02/24/2022 1132   MCH 31.6 02/24/2022 1132   MCH 30.7 10/25/2017 0522   MCHC 33.5 02/24/2022 1132   MCHC 33.2 12/27/2019 1029   RDW 13.5 02/24/2022 1132   Iron Studies No results found for: "IRON", "TIBC", "FERRITIN", "IRONPCTSAT" Lipid Panel     Component Value Date/Time   CHOL 149 02/24/2022 1132  TRIG 130 02/24/2022 1132   HDL 52 02/24/2022 1132   LDLCALC 74 02/24/2022 1132   Hepatic Function Panel     Component Value Date/Time   PROT 6.2 02/24/2022 1132   ALBUMIN 4.0 02/24/2022 1132   AST 30 02/24/2022 1132   ALT 26 02/24/2022 1132   ALKPHOS 55 02/24/2022 1132   BILITOT 0.4 02/24/2022 1132      Component Value Date/Time   TSH 1.220 02/24/2022 1132   Nutritional Lab Results  Component Value Date   VD25OH 26.8 (L) 02/24/2022     ASSESSMENT AND PLAN  TREATMENT PLAN FOR OBESITY:  Recommended Dietary Goals  [name] is currently in the action stage of change. As such, her goal is to continue weight management plan. She has agreed to the Category 1  Plan.  Patient advised to measure and make sure that she is getting at least 30 g of protein with breakfast lunch and dinner to enhance protein synthesis and thermogenesis.  This may increase calories burned.  Behavioral Intervention  We discussed the following Behavioral Modification Strategies today: increasing lean protein intake, increasing vegetables, increase water intake, work on meal planning and easy cooking plans, think about ways to increase physical activity, and reading food labels and making healthy choices when eating convenient foods.  Additional resources provided today: NA  Recommended Physical Activity Goals  Tammy [name] has been advised to work up to 150 minutes of moderate intensity aerobic activity a week and strengthening exercises 2-3 times per week for cardiovascular health, weight loss maintenance and preservation of muscle mass.   She has agreed to increase physical activity in their day and reduce sedentary time (increase NEAT).    Pharmacotherapy We discussed various medication options to help Tammy [name] with her weight loss efforts.  I explained to her that most FDA approved medications are appetite suppressants and that I do not feel she may necessarily benefit from them.  She has a very slow metabolism and is already on a reduced calorie nutrition plan of about 1000 cal and we do not want her skipping meals.  She does have insulin resistance and may benefit from metformin.  After discussion of benefits and side effects she will be started on metformin 500 mg 1 tablet daily.  ASSOCIATED CONDITIONS ADDRESSED TODAY  Prediabetes Assessment & Plan: Most recent A1c is  Lab Results  Component Value Date   HGBA1C 5.5 02/24/2022   with associated elevated insulin levels. HOMA- IR is 2.19, optimal < 1.9.  Patient informed of disease state and risk of progression. This may contribute to abnormal cravings, fatigue and diabetes complications without having diabetes.   She will  continue on reduced calorie nutrition plan low on simple sugars.  She will be started on metformin 500 mg 1 tablet daily to improve insulin resistance and assist with weight management.   Orders: -     metFORMIN HCl ER; Take 1 tablet (500 mg total) by mouth daily with breakfast.  Dispense: 30 tablet; Refill: 0  Obesity, current BMI 41.7      Return in about 2 weeks (around 06/16/2022) for For Weight Mangement with Dr. Gerarda Fraction.[name] She was informed of the importance of frequent follow up visits to maximize her success with intensive lifestyle modifications for her multiple health conditions.   ATTESTASTION STATEMENTS:  Reviewed by clinician on day of visit: allergies, medications, problem list, medical history, surgical history, family history, social history, and previous encounter notes.   Time spent on visit including pre-visit chart review and  post-visit care and charting was 30 minutes.    Thomes Dinning, MD

## 2022-06-16 ENCOUNTER — Encounter (INDEPENDENT_AMBULATORY_CARE_PROVIDER_SITE_OTHER): Payer: Self-pay | Admitting: Physician Assistant

## 2022-06-16 ENCOUNTER — Ambulatory Visit (INDEPENDENT_AMBULATORY_CARE_PROVIDER_SITE_OTHER): Payer: PPO | Admitting: Physician Assistant

## 2022-06-16 VITALS — BP 120/79 | HR 71 | Temp 98.3°F | Ht 64.0 in | Wt 237.4 lb

## 2022-06-16 DIAGNOSIS — Z6841 Body Mass Index (BMI) 40.0 and over, adult: Secondary | ICD-10-CM | POA: Diagnosis not present

## 2022-06-16 DIAGNOSIS — E559 Vitamin D deficiency, unspecified: Secondary | ICD-10-CM | POA: Diagnosis not present

## 2022-06-16 DIAGNOSIS — R7303 Prediabetes: Secondary | ICD-10-CM | POA: Diagnosis not present

## 2022-06-16 NOTE — Progress Notes (Signed)
Chief Complaint:   Tammy [name] is here to discuss her progress with her obesity treatment plan along with follow-up of her obesity related diagnoses. Tammy [name] is on the Category 1 Plan and states she is following her eating plan approximately 95% of the time. Tammy [name] states she is chair exercises 10 minutes 7 times per week.  Today's visit was #: 7 Starting weight: 264 lbs Starting date: 02/03/2022 Today's weight: 237 lbs Today's date: 06/16/2022 Total lbs lost to date: 27 lbs Total lbs lost since last in-office visit: 6 lbs  Interim History: Tammy [name] has done very well with weight loss. She has good adherence to her nutrition plan.  Concentrates on getting adequate protein.  Hunger seems better controlled overall.  She has goal of Total Knee replacement surgery in April with Dr. Theda [name].   Subjective:   1. Prediabetes A1C 5.5 at goal /Insulin 9.6- not at goal. On metformin 500 mg daily. No side effects with metformin.  Working on decreasing simple carbohydrates, increasing lean protein and exercise to promote weight loss and improve glycemic control/prevent progression to Type2 diabetes.   2. Vitamin D deficiency Vit D level 26.8 02/24/22- not at goal of 50-70.  On Ergocalciferol 50,000 IU weekly. No side effects with Ergocalciferol .   3. Morbid obesity (HCC)-start bmi 46.69 4. BMI Current 40.7   Assessment/Plan:   1. Prediabetes Continue metformin 500 mg daily. Continue nutrition plan and exercise as able to promote weight loss and improve glycemic control/prevent progression to Type 2 diabetes.   2. Vitamin D deficiency Continue ergocalciferol once weekly. Check vit D level 2-3 times yearly to avoid over supplementation.   3. Morbid obesity (HCC)-start bmi 46.69 4. BMI Current 40.7 Tammy [name] is currently in the action stage of change. As such, her goal is to continue with weight loss efforts. She has agreed to the Category 1 Plan.   Exercise goals: All adults  should avoid inactivity. Some physical activity is better than none, and adults who participate in any amount of physical activity gain some health benefits.  Behavioral modification strategies: increasing lean protein intake, decreasing simple carbohydrates, and planning for success.  Tammy [name] has agreed to follow-up with our clinic in 2 weeks. She was informed of the importance of frequent follow-up visits to maximize her success with intensive lifestyle modifications for her multiple health conditions.     Objective:   Blood pressure 120/79, pulse 71, temperature 98.3 F (36.8 C), height '5\' 4"'$  (1.626 m), weight 237 lb 6.4 oz (107.7 kg), SpO2 99 %. Body mass index is 40.75 kg/m.  General: Cooperative, alert, well developed, in no acute distress. HEENT: Conjunctivae and lids unremarkable. Cardiovascular: Regular rhythm.  Lungs: Normal work of breathing. Neurologic: No focal deficits.   Lab Results  Component Value Date   CREATININE 0.62 02/24/2022   BUN 22 02/24/2022   NA 142 02/24/2022   K 4.8 02/24/2022   CL 102 02/24/2022   CO2 24 02/24/2022   Lab Results  Component Value Date   ALT 26 02/24/2022   AST 30 02/24/2022   ALKPHOS 55 02/24/2022   BILITOT 0.4 02/24/2022   Lab Results  Component Value Date   HGBA1C 5.5 02/24/2022   Lab Results  Component Value Date   INSULIN 9.6 02/24/2022   Lab Results  Component Value Date   TSH 1.220 02/24/2022   Lab Results  Component Value Date   CHOL 149 02/24/2022   HDL 52 02/24/2022   LDLCALC 74 02/24/2022  TRIG 130 02/24/2022   Lab Results  Component Value Date   VD25OH 26.8 (L) 02/24/2022   Lab Results  Component Value Date   WBC 5.9 02/24/2022   HGB 14.3 02/24/2022   HCT 42.7 02/24/2022   MCV 95 02/24/2022   PLT 232 02/24/2022   No results found for: "IRON", "TIBC", "FERRITIN"  Obesity Behavioral Intervention:   Approximately 15 minutes were spent on the discussion below.  ASK: We discussed the  diagnosis of obesity with Tammy [name] today and Tammy [name] agreed to give [place] permission to discuss obesity behavioral modification therapy today.  ASSESS: Tammy [name] has the diagnosis of obesity and her BMI today is 40.7. [place] is in the action stage of change.   ADVISE: Tammy [name] was educated on the multiple health risks of obesity as well as the benefit of weight loss to improve her health. She was advised of the need for long term treatment and the importance of lifestyle modifications to improve her current health and to decrease her risk of future health problems.  AGREE: Multiple dietary modification options and treatment options were discussed and Tammy [name] agreed to follow the recommendations documented in the above note.  ARRANGE: Tammy [name] was educated on the importance of frequent visits to treat obesity as outlined per CMS and USPSTF guidelines and agreed to schedule her next follow up appointment today.  Attestation Statements:   Reviewed by clinician on day of visit: allergies, medications, problem list, medical history, surgical history, family history, social history, and previous encounter notes.  Time spent on visit including pre-visit chart review and post-visit care and charting was 42 minutes.

## 2022-06-18 DIAGNOSIS — M25562 Pain in left knee: Secondary | ICD-10-CM | POA: Diagnosis not present

## 2022-06-18 DIAGNOSIS — M1712 Unilateral primary osteoarthritis, left knee: Secondary | ICD-10-CM | POA: Diagnosis not present

## 2022-06-19 DIAGNOSIS — R7309 Other abnormal glucose: Secondary | ICD-10-CM | POA: Diagnosis not present

## 2022-06-19 DIAGNOSIS — Z6841 Body Mass Index (BMI) 40.0 and over, adult: Secondary | ICD-10-CM | POA: Diagnosis not present

## 2022-06-19 DIAGNOSIS — Z01818 Encounter for other preprocedural examination: Secondary | ICD-10-CM | POA: Diagnosis not present

## 2022-06-29 ENCOUNTER — Other Ambulatory Visit (INDEPENDENT_AMBULATORY_CARE_PROVIDER_SITE_OTHER): Payer: Self-pay | Admitting: Internal Medicine

## 2022-06-29 DIAGNOSIS — R7303 Prediabetes: Secondary | ICD-10-CM

## 2022-06-30 ENCOUNTER — Ambulatory Visit (INDEPENDENT_AMBULATORY_CARE_PROVIDER_SITE_OTHER): Payer: PPO | Admitting: Physician Assistant

## 2022-06-30 ENCOUNTER — Encounter (INDEPENDENT_AMBULATORY_CARE_PROVIDER_SITE_OTHER): Payer: Self-pay | Admitting: Physician Assistant

## 2022-06-30 VITALS — BP 108/69 | HR 63 | Temp 98.9°F | Ht 64.0 in | Wt 236.0 lb

## 2022-06-30 DIAGNOSIS — M1712 Unilateral primary osteoarthritis, left knee: Secondary | ICD-10-CM | POA: Diagnosis not present

## 2022-06-30 DIAGNOSIS — E559 Vitamin D deficiency, unspecified: Secondary | ICD-10-CM | POA: Diagnosis not present

## 2022-06-30 DIAGNOSIS — Z6841 Body Mass Index (BMI) 40.0 and over, adult: Secondary | ICD-10-CM | POA: Diagnosis not present

## 2022-06-30 DIAGNOSIS — R7303 Prediabetes: Secondary | ICD-10-CM | POA: Diagnosis not present

## 2022-06-30 MED ORDER — METFORMIN HCL ER 500 MG PO TB24: 500.0000 mg | ORAL_TABLET | Freq: Every day | ORAL | 0 refills | Status: DC

## 2022-06-30 NOTE — Assessment & Plan Note (Signed)
Plans for surgery 08/12/22. Total knee arthroplasty by Dr. Theda Sers Will be in rehab following TKA.  Will plan to follow in the [place] clinic every 2 weeks until surgery and then post op once mobile.

## 2022-06-30 NOTE — Progress Notes (Signed)
Office: [phone]  /  Fax: [phone]  WEIGHT SUMMARY AND BIOMETRICS  Vitals Temp: 98.9 F (37.2 C) BP: 108/69 Pulse Rate: 63 SpO2: 98 %   Anthropometric Measurements Height: '5\' 4"'$  (1.626 m) Weight: 236 lb (107 kg) BMI (Calculated): 40.49 Weight at Last Visit: 237 lb Weight Lost Since Last Visit: 1 lb Starting Weight: 272 lb Total Weight Loss (lbs): 28 lb (12.7 kg)   Body Composition  Body Fat %: 55.5 % Fat Mass (lbs): 131.2 lbs Muscle Mass (lbs): 99.8 lbs Visceral Fat Rating : 18   Other Clinical Data Fasting: no Labs: no Today's Visit #: 8 Starting Date: 02/03/22     HPI  Chief Complaint: OBESITY  Tammy [name] is here to discuss her progress with her obesity treatment plan. She is on the the Category 1 Plan and states she is following her eating plan approximately 95 % of the time. She states she is exercising/chair yoga 10 minutes 7 times per week.   Interval History:  Since last office visit she has done very well with weight loss. She is down 1 lb since last visit.  She is getting bored with Cat. 1 plan and we discussed journaling with total calories of 1100 and protein 75 grams daily. We discussed [email or web address] for recipes and also provided hand outs for chicken taco bowl and chicken bean soup recipes.  She has total knee replacement surgery scheduled for 08/12/22 and will be in rehab for 20 days following the surgery.    Pharmacotherapy: Metformin for prediabetes as primary indication.   PHYSICAL EXAM:  Blood pressure 108/69, pulse 63, temperature 98.9 F (37.2 C), height '5\' 4"'$  (1.626 m), weight 236 lb (107 kg), SpO2 98 %. Body mass index is 40.51 kg/m.  General: She is overweight, cooperative, alert, well developed, and in no acute distress. PSYCH: Has normal mood, affect and thought process.   Lungs: Normal breathing effort, no conversational dyspnea.  DIAGNOSTIC DATA REVIEWED:  BMET    Component Value Date/Time   NA 142 02/24/2022  1132   K 4.8 02/24/2022 1132   CL 102 02/24/2022 1132   CO2 24 02/24/2022 1132   GLUCOSE 91 02/24/2022 1132   GLUCOSE 89 12/27/2019 1029   BUN 22 02/24/2022 1132   CREATININE 0.62 02/24/2022 1132   CALCIUM 9.7 02/24/2022 1132   GFRNONAA >60 10/23/2017 0514   GFRAA >60 10/23/2017 0514   Lab Results  Component Value Date   HGBA1C 5.5 02/24/2022   Lab Results  Component Value Date   INSULIN 9.6 02/24/2022   Lab Results  Component Value Date   TSH 1.220 02/24/2022   CBC    Component Value Date/Time   WBC 5.9 02/24/2022 1132   WBC 5.1 12/27/2019 1029   RBC 4.52 02/24/2022 1132   RBC 4.25 12/27/2019 1029   HGB 14.3 02/24/2022 1132   HCT 42.7 02/24/2022 1132   PLT 232 02/24/2022 1132   MCV 95 02/24/2022 1132   MCH 31.6 02/24/2022 1132   MCH 30.7 10/25/2017 0522   MCHC 33.5 02/24/2022 1132   MCHC 33.2 12/27/2019 1029   RDW 13.5 02/24/2022 1132   Iron Studies No results found for: "IRON", "TIBC", "FERRITIN", "IRONPCTSAT" Lipid Panel     Component Value Date/Time   CHOL 149 02/24/2022 1132   TRIG 130 02/24/2022 1132   HDL 52 02/24/2022 1132   LDLCALC 74 02/24/2022 1132   Hepatic Function Panel     Component Value Date/Time   PROT 6.2 02/24/2022 1132  ALBUMIN 4.0 02/24/2022 1132   AST 30 02/24/2022 1132   ALT 26 02/24/2022 1132   ALKPHOS 55 02/24/2022 1132   BILITOT 0.4 02/24/2022 1132      Component Value Date/Time   TSH 1.220 02/24/2022 1132   Nutritional Lab Results  Component Value Date   VD25OH 26.8 (L) 02/24/2022    ASSOCIATED CONDITIONS ADDRESSED TODAY  ASSESSMENT AND PLAN  Problem List Items Addressed This Visit     Prediabetes - Primary    Prediabetes Last A1c was 5.5  Medication(s): Metformin 500 mg once daily with meals Lab Results  Component Value Date   HGBA1C 5.5 02/24/2022   Lab Results  Component Value Date   INSULIN 9.6 02/24/2022   Plan: Continue and refill Metformin 500 mg once daily with meals Continue working on  nutrition plan to decrease simple carbohydrates, increase lean proteins and exercise to promote weight loss, improve glycemic control and prevent progression to Type 2 diabetes.         Relevant Medications   metFORMIN (GLUCOPHAGE-XR) 500 MG 24 hr tablet   Primary osteoarthritis of left knee    Plans for surgery 08/12/22. Total knee arthroplasty by Dr. Theda Sers Will be in rehab following TKA.  Will plan to follow in the [place] clinic every 2 weeks until surgery and then post op once mobile.       Morbid obesity ([place])    Start BMI 46.69      Relevant Medications   metFORMIN (GLUCOPHAGE-XR) 500 MG 24 hr tablet   Vitamin D deficiency    Vitamin D Deficiency Vitamin D is not at goal of 50.  Most recent vitamin D level was 26.8. She is on OTC vitamin D3 5000 IU daily. and 10,000 IU on Friday, Sat, Sun.    Plan: Continue OTC vitamin D 5000 IU daily and 10,000 IU on Friday, Sat, Sun.        BMI 40.0-44.9, adult ([place]) Current BMI 40.7    Current BMI 40.6      Relevant Medications   metFORMIN (GLUCOPHAGE-XR) 500 MG 24 hr tablet      TREATMENT PLAN FOR OBESITY:  Recommended Dietary Goals  Tammy [name] is currently in the action stage of change. As such, her goal is to continue weight management plan. She has agreed to the Category 1 Plan and keeping a food journal and adhering to recommended goals of 1100 calories and 75+ grams of protein.  Behavioral Intervention  We discussed the following Behavioral Modification Strategies today: increasing lean protein intake, decreasing simple carbohydrates , increasing vegetables, increasing water intake, and work on meal planning and easy cooking plans.  Additional resources provided today: NA  Recommended Physical Activity Goals  Tammy [name] has been advised to work up to 150 minutes of moderate intensity aerobic activity a week and strengthening exercises 2-3 times per week for cardiovascular health, weight loss maintenance and preservation  of muscle mass.   She has agreed to continue physical activity as is.    Pharmacotherapy We discussed various medication options to help [facility] with her weight loss efforts and we both agreed to continue nutrition plan and metformin for primary indication of prediabetes.    Return in about 2 weeks (around 07/14/2022).[name] She was informed of the importance of frequent follow up visits to maximize her success with intensive lifestyle modifications for her multiple health conditions.   ATTESTASTION STATEMENTS:  Reviewed by clinician on day of visit: allergies, medications, problem list, medical history, surgical history, family history, social history,  and previous encounter notes.   I have personally spent 35 minutes total time today in preparation, patient care, nutritional counseling and documentation for this visit, including the following: review of clinical lab tests; review of medical tests/procedures/services.      Faryn Sieg, PA-C

## 2022-06-30 NOTE — Assessment & Plan Note (Signed)
Prediabetes Last A1c was 5.5  Medication(s): Metformin 500 mg once daily with meals Lab Results  Component Value Date   HGBA1C 5.5 02/24/2022   Lab Results  Component Value Date   INSULIN 9.6 02/24/2022    Plan: Continue and refill Metformin 500 mg once daily with meals Continue working on nutrition plan to decrease simple carbohydrates, increase lean proteins and exercise to promote weight loss, improve glycemic control and prevent progression to Type 2 diabetes.

## 2022-06-30 NOTE — Assessment & Plan Note (Signed)
Start BMI 46.69

## 2022-06-30 NOTE — Assessment & Plan Note (Signed)
Current BMI 40.6

## 2022-06-30 NOTE — Assessment & Plan Note (Signed)
Vitamin D Deficiency Vitamin D is not at goal of 50.  Most recent vitamin D level was 26.8. She is on OTC vitamin D3 5000 IU daily. and 10,000 IU on Friday, Sat, Sun.    Plan: Continue OTC vitamin D 5000 IU daily and 10,000 IU on Friday, Sat, Sun.

## 2022-07-02 ENCOUNTER — Other Ambulatory Visit (INDEPENDENT_AMBULATORY_CARE_PROVIDER_SITE_OTHER): Payer: Self-pay | Admitting: Physician Assistant

## 2022-07-02 DIAGNOSIS — R7303 Prediabetes: Secondary | ICD-10-CM

## 2022-07-15 ENCOUNTER — Ambulatory Visit (INDEPENDENT_AMBULATORY_CARE_PROVIDER_SITE_OTHER): Payer: PPO | Admitting: Physician Assistant

## 2022-07-15 ENCOUNTER — Encounter (INDEPENDENT_AMBULATORY_CARE_PROVIDER_SITE_OTHER): Payer: Self-pay | Admitting: Physician Assistant

## 2022-07-15 VITALS — BP 108/71 | HR 61 | Temp 98.6°F | Ht 64.0 in | Wt 232.0 lb

## 2022-07-15 DIAGNOSIS — Z6841 Body Mass Index (BMI) 40.0 and over, adult: Secondary | ICD-10-CM | POA: Diagnosis not present

## 2022-07-15 DIAGNOSIS — R7303 Prediabetes: Secondary | ICD-10-CM

## 2022-07-15 DIAGNOSIS — E669 Obesity, unspecified: Secondary | ICD-10-CM | POA: Diagnosis not present

## 2022-07-15 DIAGNOSIS — M1712 Unilateral primary osteoarthritis, left knee: Secondary | ICD-10-CM | POA: Diagnosis not present

## 2022-07-15 DIAGNOSIS — E559 Vitamin D deficiency, unspecified: Secondary | ICD-10-CM | POA: Diagnosis not present

## 2022-07-15 NOTE — Assessment & Plan Note (Signed)
On Celebrex daily . No side effects.  Continues to work on strengthening and walking as much as can tolerate with rolling walker prior to surgery. Plans rehab x 20 days post op.   Plan:Patient met goal BMI of 40 in preparation for TKA planned for 08/12/22. She is going to follow up closely and continue nutrition plan to promote further weight loss and improve health prior to surgey.

## 2022-07-15 NOTE — Progress Notes (Addendum)
Office: [phone]  /  Fax: [phone]  WEIGHT SUMMARY AND BIOMETRICS  Vitals Temp: 98.6 F (37 C) BP: 108/71 Pulse Rate: 61 SpO2: 99 %   Anthropometric Measurements Height: 5\' 4"  (1.626 m) Weight: 232 lb (105.2 kg) BMI (Calculated): 39.8 Weight at Last Visit: 236 lb Weight Lost Since Last Visit: 4 lb Weight Gained Since Last Visit: 0 lb Starting Weight: 272 lb   Body Composition  Body Fat %: 54.1 % Fat Mass (lbs): 126 lbs Muscle Mass (lbs): 101.4 lbs Visceral Fat Rating : 18   Other Clinical Data Fasting: No Labs: No Today's Visit #: 9 Starting Date: 02/03/22     HPI  Chief Complaint: OBESITY  Tammy [name] is here to discuss her progress with her obesity treatment plan. She is on the the Category 1 Plan and states she is following her eating plan approximately 98 % of the time. She states she is exercising /chair yoga 10 minutes 7 times per week.   Interval History:  Since last office visit she continued to do well with weight loss. Down 4lbs/ Total of 40 lbs since 01/2022! She is getting ready for total knee replacement surgery 08/12/22 . Adherence to eating plan is very good.  She has been drinking more water, exercising more- chair yoga.    Pharmacotherapy: None ( Had been on metformin, but wants to continue to donate plasma regularly and has stopped taking metformin now) Completed paperwork today for the patient to continue/resume plasma donations)  PHYSICAL EXAM:  Blood pressure 108/71, pulse 61, temperature 98.6 F (37 C), height 5\' 4"  (1.626 m), weight 232 lb (105.2 kg), SpO2 99 %. Body mass index is 39.82 kg/m.  General: She is overweight, cooperative, alert, well developed, and in no acute distress. Walks with rolling walker.  PSYCH: Has normal mood, affect and thought process.   Cardiovascular : regular rhythm Lungs: Normal breathing effort, no conversational dyspnea. Neuro: no focal deficits  DIAGNOSTIC DATA REVIEWED:  BMET     Component Value Date/Time   NA 142 02/24/2022 1132   K 4.8 02/24/2022 1132   CL 102 02/24/2022 1132   CO2 24 02/24/2022 1132   GLUCOSE 91 02/24/2022 1132   GLUCOSE 89 12/27/2019 1029   BUN 22 02/24/2022 1132   CREATININE 0.62 02/24/2022 1132   CALCIUM 9.7 02/24/2022 1132   GFRNONAA >60 10/23/2017 0514   GFRAA >60 10/23/2017 0514   Lab Results  Component Value Date   HGBA1C 5.5 02/24/2022   Lab Results  Component Value Date   INSULIN 9.6 02/24/2022   Lab Results  Component Value Date   TSH 1.220 02/24/2022   CBC    Component Value Date/Time   WBC 5.9 02/24/2022 1132   WBC 5.1 12/27/2019 1029   RBC 4.52 02/24/2022 1132   RBC 4.25 12/27/2019 1029   HGB 14.3 02/24/2022 1132   HCT 42.7 02/24/2022 1132   PLT 232 02/24/2022 1132   MCV 95 02/24/2022 1132   MCH 31.6 02/24/2022 1132   MCH 30.7 10/25/2017 0522   MCHC 33.5 02/24/2022 1132   MCHC 33.2 12/27/2019 1029   RDW 13.5 02/24/2022 1132   Iron Studies No results found for: "IRON", "TIBC", "FERRITIN", "IRONPCTSAT" Lipid Panel     Component Value Date/Time   CHOL 149 02/24/2022 1132   TRIG 130 02/24/2022 1132   HDL 52 02/24/2022 1132   LDLCALC 74 02/24/2022 1132   Hepatic Function Panel     Component Value Date/Time   PROT 6.2 02/24/2022 1132  ALBUMIN 4.0 02/24/2022 1132   AST 30 02/24/2022 1132   ALT 26 02/24/2022 1132   ALKPHOS 55 02/24/2022 1132   BILITOT 0.4 02/24/2022 1132      Component Value Date/Time   TSH 1.220 02/24/2022 1132   Nutritional Lab Results  Component Value Date   VD25OH 26.8 (L) 02/24/2022    ASSOCIATED CONDITIONS ADDRESSED TODAY  ASSESSMENT AND PLAN  Problem List Items Addressed This Visit     Primary osteoarthritis of left knee - Primary    On Celebrex daily . No side effects.  Continues to work on strengthening and walking as much as can tolerate with rolling walker prior to surgery. Plans rehab x 20 days post op.   Plan:Patient met goal BMI of 40 in preparation for  TKA planned for 08/12/22. She is going to follow up closely and continue nutrition plan to promote further weight loss and improve health prior to surgey.       Morbid obesity ([place])   Vitamin D deficiency    Vitamin D Deficiency Vitamin D is not at goal of 50.  Most recent vitamin D level was 26.8. She is on OTC vitamin D3 5000 IU daily.and 10,000 IU Fri, Sat and Sunday. No side effects with OTC vitamin D.  Lab Results  Component Value Date   VD25OH 26.8 (L) 02/24/2022   Plan: Low vitamin D levels can be associated with adiposity and may result in leptin resistance and weight gain. Also associated with fatigue.  Currently on vitamin D supplementation without any adverse effects.  Continue OTC vitamin D3 5000 IU daily and 10,000 IU Fri, Sat and Sunday. Recheck Vit D level 2-3 times yearly to avoid over supplementation.        BMI 40.0-44.9, adult (HCC) Current BMI 40.7   Current BMI 40.0 today   TREATMENT PLAN FOR OBESITY:  Recommended Dietary Goals  Tammy [name] is currently in the action stage of change. As such, her goal is to continue weight management plan. She has agreed to the Category 1 Plan.  Behavioral Intervention  We discussed the following Behavioral Modification Strategies today: increasing lean protein intake, decreasing simple carbohydrates , increasing vegetables, increasing water intake, work on meal planning and easy cooking plans, planning for success, and keeping healthy foods at home.  Additional resources provided today: NA  Recommended Physical Activity Goals  Tammy [name] has been advised to work up to 150 minutes of moderate intensity aerobic activity a week and strengthening exercises 2-3 times per week for cardiovascular health, weight loss maintenance and preservation of muscle mass.   She has agreed to Continue current level of physical activity     Return in about 2 weeks (around 07/29/2022).[name] She was informed of the importance of frequent follow up visits  to maximize her success with intensive lifestyle modifications for her multiple health conditions.   ATTESTASTION STATEMENTS:  Reviewed by clinician on day of visit: allergies, medications, problem list, medical history, surgical history, family history, social history, and previous encounter notes.   I have personally spent 40 minutes total time today in preparation, patient care, nutritional counseling and documentation for this visit, including the following: review of clinical lab tests; review of medical tests/procedures/services.      Tammy Fyfe, PA-C

## 2022-07-15 NOTE — Assessment & Plan Note (Addendum)
Vitamin D Deficiency Vitamin D is not at goal of 50.  Most recent vitamin D level was 26.8. She is on OTC vitamin D3 5000 IU daily.and 10,000 IU Fri, Sat and Sunday. No side effects with OTC vitamin D.  Lab Results  Component Value Date   VD25OH 26.8 (L) 02/24/2022    Plan: Low vitamin D levels can be associated with adiposity and may result in leptin resistance and weight gain. Also associated with fatigue.  Currently on vitamin D supplementation without any adverse effects.  Continue OTC vitamin D3 5000 IU daily and 10,000 IU Fri, Sat and Sunday. Recheck Vit D level 2-3 times yearly to avoid over supplementation.

## 2022-07-28 ENCOUNTER — Ambulatory Visit (INDEPENDENT_AMBULATORY_CARE_PROVIDER_SITE_OTHER): Payer: PPO | Admitting: Physician Assistant

## 2022-07-28 ENCOUNTER — Encounter (INDEPENDENT_AMBULATORY_CARE_PROVIDER_SITE_OTHER): Payer: Self-pay | Admitting: Physician Assistant

## 2022-07-28 VITALS — BP 131/77 | HR 57 | Temp 97.8°F | Ht 64.0 in | Wt 234.0 lb

## 2022-07-28 DIAGNOSIS — Z6841 Body Mass Index (BMI) 40.0 and over, adult: Secondary | ICD-10-CM | POA: Diagnosis not present

## 2022-07-28 DIAGNOSIS — R7303 Prediabetes: Secondary | ICD-10-CM

## 2022-07-28 DIAGNOSIS — M1712 Unilateral primary osteoarthritis, left knee: Secondary | ICD-10-CM

## 2022-07-28 NOTE — Assessment & Plan Note (Signed)
Prediabetes Last A1c was 5.5- at goal / Insulin 9.6- nearing goal of < 5.   Medication(s): None She is working on nutrition plan to decrease simple carbohydrates, increase lean proteins and exercise to promote weight loss, improve glycemic control and prevent progression to Type 2 diabetes.   Lab Results  Component Value Date   HGBA1C 5.5 02/24/2022   Lab Results  Component Value Date   INSULIN 9.6 02/24/2022    Plan:  Continue working on nutrition plan to decrease simple carbohydrates, increase lean proteins and exercise to promote weight loss, improve glycemic control and prevent progression to Type 2 diabetes.

## 2022-07-28 NOTE — Progress Notes (Signed)
Office: [phone]  /  Fax: [phone]  WEIGHT SUMMARY AND BIOMETRICS  Vitals Temp: 97.8 F (36.6 C) BP: 131/77 Pulse Rate: (!) 57 SpO2: 98 %   Anthropometric Measurements Height: 5\' 4"  (1.626 m) Weight: 234 lb (106.1 kg) BMI (Calculated): 40.15 Weight at Last Visit: 232 lb Weight Lost Since Last Visit: 0 lb Weight Gained Since Last Visit: 2 lb Starting Weight: 264 lb   Body Composition  Body Fat %: 54.9 % Fat Mass (lbs): 128.6 lbs Muscle Mass (lbs): 100.4 lbs Visceral Fat Rating : 18   Other Clinical Data Fasting: no Labs: no Today's Visit #: 10 Starting Date: 02/03/22     HPI  Chief Complaint: OBESITY  Tammy [name] is here to discuss her progress with her obesity treatment plan. She is on the the Category 1 Plan and states she is following her eating plan approximately 93 % of the time. She states she is exercising/chair 10 minutes 6-7 times per week.   Interval History:  Since last office visit she has been getting ready for her total knee replacement surgery.  She got slightly off track with Easter and is doing some emotional eating with increased stress of getting ready for major surgery.  We discussed some strategies to keep her mind occupied like adult art books and Felicity Pellegrini talks for inspiration.  She is supplementing with protein shakes in the morning and we discussed options for smoothies in the morning and recipes provided.  She has good overall adherence to her nutrition plan.    Pharmacotherapy: None for weight loss.   PHYSICAL EXAM:  Blood pressure 131/77, pulse (!) 57, temperature 97.8 F (36.6 C), height 5\' 4"  (1.626 m), weight 234 lb (106.1 kg), SpO2 98 %. Body mass index is 40.17 kg/m.  General: She is overweight, cooperative, alert, well developed, and in no acute distress. Walks with rolling walker PSYCH: Has normal mood, affect and thought process.   Cardiovascular: regular rhythm Lungs: Normal breathing effort, no conversational  dyspnea. Neuro: no focal deficits  DIAGNOSTIC DATA REVIEWED:  BMET    Component Value Date/Time   NA 142 02/24/2022 1132   K 4.8 02/24/2022 1132   CL 102 02/24/2022 1132   CO2 24 02/24/2022 1132   GLUCOSE 91 02/24/2022 1132   GLUCOSE 89 12/27/2019 1029   BUN 22 02/24/2022 1132   CREATININE 0.62 02/24/2022 1132   CALCIUM 9.7 02/24/2022 1132   GFRNONAA >60 10/23/2017 0514   GFRAA >60 10/23/2017 0514   Lab Results  Component Value Date   HGBA1C 5.5 02/24/2022   Lab Results  Component Value Date   INSULIN 9.6 02/24/2022   Lab Results  Component Value Date   TSH 1.220 02/24/2022   CBC    Component Value Date/Time   WBC 5.9 02/24/2022 1132   WBC 5.1 12/27/2019 1029   RBC 4.52 02/24/2022 1132   RBC 4.25 12/27/2019 1029   HGB 14.3 02/24/2022 1132   HCT 42.7 02/24/2022 1132   PLT 232 02/24/2022 1132   MCV 95 02/24/2022 1132   MCH 31.6 02/24/2022 1132   MCH 30.7 10/25/2017 0522   MCHC 33.5 02/24/2022 1132   MCHC 33.2 12/27/2019 1029   RDW 13.5 02/24/2022 1132   Iron Studies No results found for: "IRON", "TIBC", "FERRITIN", "IRONPCTSAT" Lipid Panel     Component Value Date/Time   CHOL 149 02/24/2022 1132   TRIG 130 02/24/2022 1132   HDL 52 02/24/2022 1132   LDLCALC 74 02/24/2022 1132   Hepatic Function Panel  Component Value Date/Time   PROT 6.2 02/24/2022 1132   ALBUMIN 4.0 02/24/2022 1132   AST 30 02/24/2022 1132   ALT 26 02/24/2022 1132   ALKPHOS 55 02/24/2022 1132   BILITOT 0.4 02/24/2022 1132      Component Value Date/Time   TSH 1.220 02/24/2022 1132   Nutritional Lab Results  Component Value Date   VD25OH 26.8 (L) 02/24/2022    ASSOCIATED CONDITIONS ADDRESSED TODAY  ASSESSMENT AND PLAN  Problem List Items Addressed This Visit     Prediabetes - Primary    Prediabetes Last A1c was 5.5- at goal / Insulin 9.6- nearing goal of < 5.   Medication(s): None She is working on nutrition plan to decrease simple carbohydrates, increase lean  proteins and exercise to promote weight loss, improve glycemic control and prevent progression to Type 2 diabetes.   Lab Results  Component Value Date   HGBA1C 5.5 02/24/2022   Lab Results  Component Value Date   INSULIN 9.6 02/24/2022   Plan:  Continue working on nutrition plan to decrease simple carbohydrates, increase lean proteins and exercise to promote weight loss, improve glycemic control and prevent progression to Type 2 diabetes.         Primary osteoarthritis of left knee    For total knee replacement surgery 08/12/22 and she will go to inpatient rehab following surgery for 20 days.   Plan: Continue nutrition plan and exercise to promote weight loss and improve post operative recovery.       Morbid obesity   BMI 40.0-44.9, adult (HCC) Current BMI 40.7   Current BMI 40.2 Has done very well with weight loss. Down 30 lbs since 02/2022!  TREATMENT PLAN FOR OBESITY:  Recommended Dietary Goals  [name] is currently in the action stage of change. As such, her goal is to continue weight management plan. She has agreed to the Category 1 Plan.  Behavioral Intervention  We discussed the following Behavioral Modification Strategies today: increasing lean protein intake, decreasing simple carbohydrates , increasing vegetables, increasing fiber rich foods, increasing water intake, emotional eating strategies and understanding the difference between hunger signals and cravings, and planning for success.  Additional resources provided today: NA  Recommended Physical Activity Goals  [name] has been advised to work up to 150 minutes of moderate intensity aerobic activity a week and strengthening exercises 2-3 times per week for cardiovascular health, weight loss maintenance and preservation of muscle mass.   She has agreed to Continue current level of physical activity    Pharmacotherapy We discussed various medication options to help [name] with her weight loss efforts and  we both agreed to continue to work on nutritional and behavioral strategies to promote weight loss.    Return in about 2 weeks (around 08/11/2022).[name] She was informed of the importance of frequent follow up visits to maximize her success with intensive lifestyle modifications for her multiple health conditions.   ATTESTASTION STATEMENTS:  Reviewed by clinician on day of visit: allergies, medications, problem list, medical history, surgical history, family history, social history, and previous encounter notes.   I have personally spent 43 minutes total time today in preparation, patient care, nutritional counseling and documentation for this visit, including the following: review of clinical lab tests; review of medical tests/procedures/services.      Aaban Griep, PA-C

## 2022-07-28 NOTE — Assessment & Plan Note (Signed)
For total knee replacement surgery 08/12/22 and she will go to inpatient rehab following surgery for 20 days.   Plan: Continue nutrition plan and exercise to promote weight loss and improve post operative recovery.

## 2022-07-29 NOTE — Progress Notes (Signed)
Sent message, via epic in basket, requesting orders in epic from surgeon.  

## 2022-07-31 NOTE — H&P (Signed)
TOTAL KNEE ADMISSION H&P  Patient is being admitted for left total knee arthroplasty.  Subjective:  Chief Complaint:left knee pain.  HPI: Tammy [name], 67 y.o. female, has a history of pain and functional disability in the left knee due to arthritis and has failed non-surgical conservative treatments for greater than 12 weeks to includeNSAID's and/or analgesics, corticosteriod injections, viscosupplementation injections, flexibility and strengthening excercises, supervised PT with diminished ADL's post treatment, use of assistive devices, weight reduction as appropriate, and activity modification.  Onset of symptoms was gradual, starting 1 years ago with gradually worsening course since that time. The patient noted no past surgery on the left knee(s).  Patient currently rates pain in the left knee(s) at 4 out of 10 with activity. Patient has night pain, worsening of pain with activity and weight bearing, pain that interferes with activities of daily living, pain with passive range of motion, and joint swelling.  Patient has evidence of subchondral sclerosis, periarticular osteophytes, and joint space narrowing by imaging studies. This patient has had  No previous injury . There is no active infection.  Patient Active Problem List   Diagnosis Date Noted   BMI 40.0-44.9, adult (HCC) Current BMI 40.7 06/30/2022   Numbness 05/07/2022   Osteoarthritis of left knee 02/25/2022   Vitamin D deficiency 02/25/2022   Depression 02/25/2022   Morbid obesity 02/24/2022   Depression screening 02/24/2022   SOB (shortness of breath) 02/24/2022   Other fatigue 02/24/2022   Class 3 severe obesity with serious comorbidity and body mass index (BMI) of 45.0 to 49.9 in adult 02/10/2022   Prediabetes 02/03/2022   Primary osteoarthritis of left knee 02/03/2022   Caregiver stress 02/03/2022   Gastroesophageal reflux disease 02/03/2022   Osteoarthritis of right knee 10/22/2017   S/P knee replacement 10/22/2017    Black stool 01/16/2014   Rectal bleeding 01/16/2014   Esophageal reflux 09/16/2010   Special screening for malignant neoplasms, colon 09/16/2010   DIABETES MELLITUS 09/24/2007   ALLERGIC RHINITIS 09/24/2007   DYSFUNCTIONAL UTERINE BLEEDING 09/24/2007   PLANTAR FASCIITIS 09/24/2007   OVARIAN CYST, RIGHT 07/09/2006   SCHATZKI'S RING, HX OF 08/27/1998   HIATAL HERNIA 07/19/1998   Past Medical History:  Diagnosis Date   Allergic rhinitis, cause unspecified    Back pain    Diaphragmatic hernia without mention of obstruction or gangrene    GERD (gastroesophageal reflux disease)    IBS (irritable bowel syndrome)    Lymphocytic colitis    Osteoarthritis    Other and unspecified ovarian cyst    Other disorder of menstruation and other abnormal bleeding from female genital tract    Plantar fascial fibromatosis    Prediabetes    Schatzki's ring    Stomach ulcer    Stricture and stenosis of esophagus    Vitamin D deficiency     Past Surgical History:  Procedure Laterality Date   BACK SURGERY     lumbar   ESOPHAGOGASTRODUODENOSCOPY (EGD) WITH PROPOFOL N/A 01/29/2014   Procedure: ESOPHAGOGASTRODUODENOSCOPY (EGD) WITH PROPOFOL;  Surgeon: Louis Meckel, MD;  Location: [facility];  Service: Endoscopy;  Laterality: N/A;   KNEE SURGERY Right 02/2012   trorn meniscus repair   TONSILLECTOMY     TOTAL KNEE ARTHROPLASTY Right 10/22/2017   Procedure: RIGHT TOTAL KNEE ARTHROPLASTY;  Surgeon: Eugenia Mcalpine, MD;  Location: [facility];  Service: Orthopedics;  Laterality: Right;    No current facility-administered medications for this encounter.   Current Outpatient Medications  Medication Sig Dispense Refill Last Dose  acetaminophen (TYLENOL) 500 MG tablet Take 500 mg by mouth 2 (two) times daily as needed for mild pain or headache.       Ascorbic Acid (VITAMIN C) 1000 MG tablet Take 1,000 mg by mouth daily.      celecoxib (CELEBREX) 200 MG capsule Take 200 mg by mouth daily.       Cholecalciferol (VITAMIN D3) 125 MCG (5000 UT) TABS 5000 IU daily and 10,000 IU Fri, Sat and Sun 30 tablet     famotidine (PEPCID) 20 MG tablet Take 1 tablet (20 mg total) by mouth 2 (two) times daily.      loperamide (IMODIUM A-D) 2 MG tablet Take 2 mg by mouth 4 (four) times daily as needed for diarrhea or loose stools.      loratadine (CLARITIN) 10 MG tablet Take 10 mg by mouth every morning.      Multiple Vitamins-Minerals (MULTIVITAMIN PO) Take 1 tablet by mouth every morning. Women's Centrum Silver      Allergies  Allergen Reactions   Benadryl [Diphenhydramine] Other (See Comments)    Reports it makes her fall asleep.   Budesonide    Prednisone    Propoxyphene    Amoxicillin Rash   Carisprodol [Carisoprodol] Itching and Rash   Doxycycline Itching and Rash   Penicillins Rash    Has patient had a PCN reaction causing immediate rash, facial/tongue/throat swelling, SOB or lightheadedness with hypotension: yes Has patient had a PCN reaction causing severe rash involving mucus membranes or skin necrosis: no Has patient had a PCN reaction that required hospitalization: no Has patient had a PCN reaction occurring within the last 10 years: yes If all of the above answers are "NO", then may proceed with Cephalosporin use.     Social History   Tobacco Use   Smoking status: Never   Smokeless tobacco: Never  Substance Use Topics   Alcohol use: No    Family History  Problem Relation Age of Onset   Hypertension Mother    Stroke Mother    Obesity Mother    Colon cancer Neg Hx    Esophageal cancer Neg Hx    Rectal cancer Neg Hx    Stomach cancer Neg Hx      Review of Systems  All other systems reviewed and are negative.   Objective:  Physical Exam Constitutional:      Appearance: Normal appearance.  HENT:     Head: Normocephalic and atraumatic.  Eyes:     Extraocular Movements: Extraocular movements intact.     Pupils: Pupils are equal, round, and reactive to light.   Cardiovascular:     Rate and Rhythm: Normal rate and regular rhythm.     Pulses: Normal pulses.     Heart sounds: Normal heart sounds. No murmur heard.    No friction rub. No gallop.  Pulmonary:     Effort: Pulmonary effort is normal.     Breath sounds: Normal breath sounds.  Musculoskeletal:        General: Swelling and tenderness (medial and lateral joint line) present.  Skin:    General: Skin is warm and dry.     Capillary Refill: Capillary refill takes less than 2 seconds.  Neurological:     General: No focal deficit present.     Mental Status: She is alert and oriented to person, place, and time.  Psychiatric:        Mood and Affect: Mood normal.        Behavior: Behavior normal.  Thought Content: Thought content normal.        Judgment: Judgment normal.     Vital signs in last 24 hours:    Labs:   Estimated body mass index is 40.17 kg/m as calculated from the following:   Height as of 07/28/22: 5\' 4"  (1.626 m).   Weight as of 07/28/22: 106.1 kg.   Imaging Review Plain radiographs demonstrate severe degenerative joint disease of the left knee(s). The overall alignment issignificant varus. The bone quality appears to be good for age and reported activity level.      Assessment/Plan:  End stage arthritis, left knee   The patient history, physical examination, clinical judgment of the provider and imaging studies are consistent with end stage degenerative joint disease of the left knee(s) and total knee arthroplasty is deemed medically necessary. The treatment options including medical management, injection therapy arthroscopy and arthroplasty were discussed at length. The risks and benefits of total knee arthroplasty were presented and reviewed. The risks due to aseptic loosening, infection, stiffness, patella tracking problems, thromboembolic complications and other imponderables were discussed. The patient acknowledged the explanation, agreed to proceed with  the plan and consent was signed. Patient is being admitted for inpatient treatment for surgery, pain control, PT, OT, prophylactic antibiotics, VTE prophylaxis, progressive ambulation and ADL's and discharge planning. The patient is planning to be discharged to skilled nursing facility  She already has a walker for discharge  She [facility] be [facility] after surgery  Will be doing ASA 81 mg BID for DVT ppx    Patient's anticipated LOS is less than 2 midnights, meeting these requirements: - Younger than 25 - Lives within 1 hour of care - Has a competent adult at home to recover with post-op recover - NO history of  - Chronic pain requiring opiods  - Diabetes  - Coronary Artery Disease  - Heart failure  - Heart attack  - Stroke  - DVT/VTE  - Cardiac arrhythmia  - Respiratory Failure/COPD  - Renal failure  - Anemia  - Advanced Liver disease

## 2022-08-03 NOTE — Progress Notes (Signed)
Anesthesia Review:  PCP: Shawn Rayburn LOV 07/28/22  Neuro- DR Epimenio Foot LOV 05/07/22  Cardiologist : Chest x-ray : EKG : Echo : Stress test: Cardiac Cath :  Activity level:  Sleep Study/ CPAP : Fasting Blood Sugar :      / Checks Blood Sugar -- times a day:   Blood Thinner/ Instructions /Last Dose: ASA / Instructions/ Last Dose :    Pre DM -  Hgba1c-

## 2022-08-03 NOTE — Patient Instructions (Signed)
SURGICAL WAITING ROOM VISITATION  Patients having surgery or a procedure may have no more than 2 support people in the waiting area - these visitors may rotate.    Children under the age of 21 must have an adult with them who is not the patient.  Due to an increase in RSV and influenza rates and associated hospitalizations, children ages 40 and under may not visit patients in [facility] hospitals.  If the patient needs to stay at the hospital during part of their recovery, the visitor guidelines for inpatient rooms apply. Pre-op nurse will coordinate an appropriate time for 1 support person to accompany patient in pre-op.  This support person may not rotate.    Please refer to the [facility] website for the visitor guidelines for Inpatients (after your surgery is over and you are in a regular room).       Your procedure is scheduled on:  08/12/22    Report to [facility] Main Entrance    Report to admitting at  0515 AM   Call this number if you have problems the morning of surgery [phone]   Do not eat food :After Midnight.   After Midnight you may have the following liquids until __0430____ AM DAY OF SURGERY   Water Non-Citrus Juices (without pulp, NO RED-Apple, White grape, White cranberry) Black Coffee (NO MILK/CREAM OR CREAMERS, sugar ok)  Clear Tea (NO MILK/CREAM OR CREAMERS, sugar ok) regular and decaf                             Plain Jell-O (NO RED)                                           Fruit ices (not with fruit pulp, NO RED)                                     Popsicles (NO RED)                                                               Sports drinks like Gatorade (NO RED)       The day of surgery:  Drink ONE (1) Pre-Surgery Clear Ensure or G2 at   0430AM   ( have completed by ) the morning of surgery. Drink in one sitting. Do not sip.  This drink was given to you during your hospital  pre-op appointment visit. Nothing else to drink after  completing the  Pre-Surgery Clear Ensure or G2.          If you have questions, please contact your surgeon's office.       Oral Hygiene is also important to reduce your risk of infection.                                    Remember - BRUSH YOUR TEETH THE MORNING OF SURGERY WITH YOUR REGULAR TOOTHPASTE  DENTURES WILL BE REMOVED PRIOR TO SURGERY PLEASE  DO NOT APPLY "Poly grip" OR ADHESIVES!!!   Do NOT smoke after Midnight   Take these medicines the morning of surgery with A SIP OF WATER:  pepcid, claritin   DO NOT TAKE ANY ORAL DIABETIC MEDICATIONS DAY OF YOUR SURGERY  Bring CPAP mask and tubing day of surgery.                              You may not have any metal on your body including hair pins, jewelry, and body piercing             Do not wear make-up, lotions, powders, perfumes/cologne, or deodorant  Do not wear nail polish including gel and S&S, artificial/acrylic nails, or any other type of covering on natural nails including finger and toenails. If you have artificial nails, gel coating, etc. that needs to be removed by a nail salon please have this removed prior to surgery or surgery may need to be canceled/ delayed if the surgeon/ anesthesia feels like they are unable to be safely monitored.   Do not shave  48 hours prior to surgery.               Men may shave face and neck.   Do not bring valuables to the hospital. [facility] IS NOT             RESPONSIBLE   FOR VALUABLES.   Contacts, glasses, dentures or bridgework may not be worn into surgery.   Bring small overnight bag day of surgery.   DO NOT BRING YOUR HOME MEDICATIONS TO THE HOSPITAL. PHARMACY WILL DISPENSE MEDICATIONS LISTED ON YOUR MEDICATION LIST TO YOU DURING YOUR ADMISSION IN THE HOSPITAL!    Patients discharged on the day of surgery will not be allowed to drive home.  Someone NEEDS to stay with you for the first 24 hours after anesthesia.   Special Instructions: Bring a copy of your healthcare  power of attorney and living will documents the day of surgery if you haven't scanned them before.              Please read over the following fact sheets you were given: IF YOU HAVE QUESTIONS ABOUT YOUR PRE-OP INSTRUCTIONS PLEASE CALL [phone]   If you received a COVID test during your pre-op visit  it is requested that you wear a mask when out in public, stay away from anyone that may not be feeling well and notify your surgeon if you develop symptoms. If you test positive for Covid or have been in contact with anyone that has tested positive in the last 10 days please notify you surgeon.

## 2022-08-05 ENCOUNTER — Encounter (HOSPITAL_COMMUNITY): Payer: Self-pay

## 2022-08-05 ENCOUNTER — Encounter (HOSPITAL_COMMUNITY)
Admission: RE | Admit: 2022-08-05 | Discharge: 2022-08-05 | Disposition: A | Discharge status: Home / Self Care | Payer: PPO | Source: Ambulatory Visit | Attending: Specialist | Admitting: Specialist

## 2022-08-05 ENCOUNTER — Other Ambulatory Visit: Payer: Self-pay

## 2022-08-05 VITALS — BP 132/76 | HR 58 | Temp 98.8°F | Resp 16 | Ht 64.0 in | Wt 234.0 lb

## 2022-08-05 DIAGNOSIS — Z01818 Encounter for other preprocedural examination: Secondary | ICD-10-CM | POA: Insufficient documentation

## 2022-08-05 LAB — CBC
HCT: 43.7 % (ref 36.0–46.0)
Hemoglobin: 14.2 g/dL (ref 12.0–15.0)
MCH: 31.1 pg (ref 26.0–34.0)
MCHC: 32.5 g/dL (ref 30.0–36.0)
MCV: 95.8 fL (ref 80.0–100.0)
Platelets: 198 10*3/uL (ref 150–400)
RBC: 4.56 MIL/uL (ref 3.87–5.11)
RDW: 13.5 % (ref 11.5–15.5)
WBC: 5.8 10*3/uL (ref 4.0–10.5)
nRBC: 0 % (ref 0.0–0.2)

## 2022-08-05 LAB — COMPREHENSIVE METABOLIC PANEL
ALT: 21 U/L (ref 0–44)
AST: 23 U/L (ref 15–41)
Albumin: 4.2 g/dL (ref 3.5–5.0)
Alkaline Phosphatase: 52 U/L (ref 38–126)
Anion gap: 8 (ref 5–15)
BUN: 15 mg/dL (ref 8–23)
CO2: 27 mmol/L (ref 22–32)
Calcium: 9.4 mg/dL (ref 8.9–10.3)
Chloride: 105 mmol/L (ref 98–111)
Creatinine, Ser: 0.68 mg/dL (ref 0.44–1.00)
GFR, Estimated: >60 mL/min (ref >60)
Glucose, Bld: 97 mg/dL (ref 70–99)
Potassium: 4.8 mmol/L (ref 3.5–5.1)
Sodium: 140 mmol/L (ref 135–145)
Total Bilirubin: 1 mg/dL (ref 0.3–1.2)
Total Protein: 7.4 g/dL (ref 6.5–8.1)

## 2022-08-05 LAB — HEMOGLOBIN A1C
Hgb A1c MFr Bld: 5.2 % (ref 4.8–5.6)
Mean Plasma Glucose: 102.54 mg/dL

## 2022-08-05 LAB — SURGICAL PCR SCREEN
MRSA, PCR: NEGATIVE
Staphylococcus aureus: POSITIVE — AB

## 2022-08-05 LAB — TYPE AND SCREEN: Antibody Screen: NEGATIVE

## 2022-08-10 ENCOUNTER — Encounter (INDEPENDENT_AMBULATORY_CARE_PROVIDER_SITE_OTHER): Payer: Self-pay | Admitting: Physician Assistant

## 2022-08-10 ENCOUNTER — Ambulatory Visit (INDEPENDENT_AMBULATORY_CARE_PROVIDER_SITE_OTHER): Payer: PPO | Admitting: Physician Assistant

## 2022-08-10 VITALS — BP 112/74 | HR 68 | Temp 97.8°F | Ht 64.0 in | Wt 230.0 lb

## 2022-08-10 DIAGNOSIS — R7303 Prediabetes: Secondary | ICD-10-CM | POA: Diagnosis not present

## 2022-08-10 DIAGNOSIS — M1712 Unilateral primary osteoarthritis, left knee: Secondary | ICD-10-CM | POA: Diagnosis not present

## 2022-08-10 DIAGNOSIS — Z6839 Body mass index (BMI) 39.0-39.9, adult: Secondary | ICD-10-CM | POA: Diagnosis not present

## 2022-08-10 DIAGNOSIS — Z6837 Body mass index (BMI) 37.0-37.9, adult: Secondary | ICD-10-CM | POA: Insufficient documentation

## 2022-08-10 NOTE — Assessment & Plan Note (Signed)
Plans for left TKA this week and inpatient rehab for 20 days following surgery.   Plan:  Will follow up after surgery. Encouraged to continue protein supplementation post operatively to aid healing process, and promote maintenance or continued weight loss following surgery.  Discussed strategies to help avoid snacking, comfort eating post op.

## 2022-08-10 NOTE — Assessment & Plan Note (Signed)
Prediabetes Last A1c was 5.2/ Insulin 9.6-   Medication(s): None She is working on nutrition plan to decrease simple carbohydrates, increase lean proteins and exercise to promote weight loss, improve glycemic control and prevent progression to Type 2 diabetes.   Lab Results  Component Value Date   HGBA1C 5.2 08/05/2022   HGBA1C 5.5 02/24/2022   Lab Results  Component Value Date   INSULIN 9.6 02/24/2022    Plan: Continue working on nutrition plan to decrease simple carbohydrates, increase lean proteins and exercise to promote weight loss, improve glycemic control and prevent progression to Type 2 diabetes.  Strategies for post op nutrition and behavioral strategies to promote healing and avoid weight gain reviewed today.

## 2022-08-10 NOTE — Progress Notes (Signed)
Office: [phone]  /  Fax: [phone]  WEIGHT SUMMARY AND BIOMETRICS  Vitals Temp: 97.8 F (36.6 C) BP: 112/74 Pulse Rate: 68 SpO2: 98 %   Anthropometric Measurements Height:  (1.626 m) Weight: 230 lb (104.3 kg) BMI (Calculated): 39.46 Weight at Last Visit: 234 lb Weight Lost Since Last Visit: 4 lb Weight Gained Since Last Visit: 0 lb Starting Weight: 272 lb Total Weight Loss (lbs): 42 lb (19.1 kg)   Body Composition  Body Fat %: 53.4 % Fat Mass (lbs): 123.2 lbs Muscle Mass (lbs): 102 lbs Visceral Fat Rating : 17   Other Clinical Data Fasting: No Labs: No Today's Visit #: 11 Starting Date: 02/03/22     HPI  Chief Complaint: OBESITY  Tammy [name] is here to discuss her progress with her obesity treatment plan. She is on the the Category 1 Plan and states she is following her eating plan approximately 95 % of the time. She states she is exercising/walking/Silver Sneakers 10 minutes 5 times per week.   Interval History:  Since last office visit she is down 4 lbs. BMI 39.6 today!  She is going for Left total knee replacement this week 08/12/22 with Dr. Thomasena Edis.  She has done well with weight loss and we discussed strategies to make sure she does well post op. Continue protein supplementation in the post op period to promote healing.  She has good strategies to avoid temptations during the post op period like using adult coloring books to stay busy and her sister from [place] is coming to help her through the post op period as well as her husband.   Pharmacotherapy: None for weight loss  PHYSICAL EXAM:  Blood pressure 112/74, pulse 68, temperature 97.8 F (36.6 C), height  (1.626 m), weight 230 lb (104.3 kg), SpO2 98 %. Body mass index is 39.48 kg/m.  General: She is overweight, cooperative, alert, well developed, and in no acute distress. PSYCH: Has normal mood, affect and thought process.   Cardiovascular: regular rhythm, HR 60's Lungs:  Normal breathing effort, no conversational dyspnea. Neuro: no focal deficits. Walks with rolling walker or behind WC due to knees.   DIAGNOSTIC DATA REVIEWED:  BMET    Component Value Date/Time   NA 140 08/05/2022 1415   NA 142 02/24/2022 1132   K 4.8 08/05/2022 1415   CL 105 08/05/2022 1415   CO2 27 08/05/2022 1415   GLUCOSE 97 08/05/2022 1415   BUN 15 08/05/2022 1415   BUN 22 02/24/2022 1132   CREATININE 0.68 08/05/2022 1415   CALCIUM 9.4 08/05/2022 1415   GFRNONAA >60 08/05/2022 1415   GFRAA >60 10/23/2017 0514   Lab Results  Component Value Date   HGBA1C 5.2 08/05/2022   HGBA1C 5.5 02/24/2022   Lab Results  Component Value Date   INSULIN 9.6 02/24/2022   Lab Results  Component Value Date   TSH 1.220 02/24/2022   CBC    Component Value Date/Time   WBC 5.8 08/05/2022 1415   RBC 4.56 08/05/2022 1415   HGB 14.2 08/05/2022 1415   HGB 14.3 02/24/2022 1132   HCT 43.7 08/05/2022 1415   HCT 42.7 02/24/2022 1132   PLT 198 08/05/2022 1415   PLT 232 02/24/2022 1132   MCV 95.8 08/05/2022 1415   MCV 95 02/24/2022 1132   MCH 31.1 08/05/2022 1415   MCHC 32.5 08/05/2022 1415   RDW 13.5 08/05/2022 1415   RDW 13.5 02/24/2022 1132   Iron Studies No results found for: "IRON", "  TIBC", "FERRITIN", "IRONPCTSAT" Lipid Panel     Component Value Date/Time   CHOL 149 02/24/2022 1132   TRIG 130 02/24/2022 1132   HDL 52 02/24/2022 1132   LDLCALC 74 02/24/2022 1132   Hepatic Function Panel     Component Value Date/Time   PROT 7.4 08/05/2022 1415   PROT 6.2 02/24/2022 1132   ALBUMIN 4.2 08/05/2022 1415   ALBUMIN 4.0 02/24/2022 1132   AST 23 08/05/2022 1415   ALT 21 08/05/2022 1415   ALKPHOS 52 08/05/2022 1415   BILITOT 1.0 08/05/2022 1415   BILITOT 0.4 02/24/2022 1132      Component Value Date/Time   TSH 1.220 02/24/2022 1132   Nutritional Lab Results  Component Value Date   VD25OH 26.8 (L) 02/24/2022    ASSOCIATED CONDITIONS ADDRESSED TODAY  ASSESSMENT AND  PLAN  Problem List Items Addressed This Visit     Prediabetes - Primary    Prediabetes Last A1c was 5.2/ Insulin 9.6-   Medication(s): None She is working on nutrition plan to decrease simple carbohydrates, increase lean proteins and exercise to promote weight loss, improve glycemic control and prevent progression to Type 2 diabetes.   Lab Results  Component Value Date   HGBA1C 5.2 08/05/2022   HGBA1C 5.5 02/24/2022   Lab Results  Component Value Date   INSULIN 9.6 02/24/2022   Plan: Continue working on nutrition plan to decrease simple carbohydrates, increase lean proteins and exercise to promote weight loss, improve glycemic control and prevent progression to Type 2 diabetes.  Strategies for post op nutrition and behavioral strategies to promote healing and avoid weight gain reviewed today.        Primary osteoarthritis of left knee    Plans for left TKA this week and inpatient rehab for 20 days following surgery.   Plan:  Will follow up after surgery. Encouraged to continue protein supplementation post operatively to aid healing process, and promote maintenance or continued weight loss following surgery.  Discussed strategies to help avoid snacking, comfort eating post op.        Morbid obesity   BMI 39.0-39.9,adult Current BMI 39.6  Current BMI 39.6 TREATMENT PLAN FOR OBESITY:  Recommended Dietary Goals  Tammy [name] is currently in the action stage of change. As such, her goal is to continue weight management plan. She has agreed to the Category 1 Plan.  Behavioral Intervention  We discussed the following Behavioral Modification Strategies today: increasing lean protein intake, decreasing simple carbohydrates , increasing vegetables, increasing lower glycemic fruits, increasing water intake, continue to practice mindfulness when eating, and planning for success.  Additional resources provided today: NA  Recommended Physical Activity Goals  Tammy [name] has been advised  to work up to 150 minutes of moderate intensity aerobic activity a week and strengthening exercises 2-3 times per week for cardiovascular health, weight loss maintenance and preservation of muscle mass.   She has agreed to Continue current level of physical activity    Pharmacotherapy We discussed various medication options to help [place] with her weight loss efforts and we both agreed to continue to work on nutritional and behavioral strategies to promote weight loss.  .    Return in about 4 weeks (around 09/07/2022).[name] She was informed of the importance of frequent follow up visits to maximize her success with intensive lifestyle modifications for her multiple health conditions.   ATTESTASTION STATEMENTS:  Reviewed by clinician on day of visit: allergies, medications, problem list, medical history, surgical history, family history, social history, and previous  encounter notes.   I have personally spent 33 minutes total time today in preparation, patient care, nutritional counseling and documentation for this visit, including the following: review of clinical lab tests; review of medical tests/procedures/services.      Nasser Ku, PA-C

## 2022-08-11 NOTE — Anesthesia Preprocedure Evaluation (Signed)
Anesthesia Evaluation  Patient identified by MRN, date of birth, ID band Patient awake    Reviewed: Allergy & Precautions, NPO status , Patient's Chart, lab work & pertinent test results  Airway Mallampati: II  TM Distance: <3 FB Neck ROM: Full    Dental  (+) Dental Advisory Given   Pulmonary neg pulmonary ROS   breath sounds clear to auscultation       Cardiovascular negative cardio ROS  Rhythm:Regular Rate:Normal     Neuro/Psych negative neurological ROS     GI/Hepatic Neg liver ROS, PUD,GERD  ,,  Endo/Other  diabetes    Renal/GU negative Renal ROS     Musculoskeletal  (+) Arthritis ,    Abdominal   Peds  Hematology negative hematology ROS (+)   Anesthesia Other Findings   Reproductive/Obstetrics                             Lab Results  Component Value Date   WBC 5.8 08/05/2022   HGB 14.2 08/05/2022   HCT 43.7 08/05/2022   MCV 95.8 08/05/2022   PLT 198 08/05/2022   Lab Results  Component Value Date   CREATININE 0.68 08/05/2022   BUN 15 08/05/2022   NA 140 08/05/2022   K 4.8 08/05/2022   CL 105 08/05/2022   CO2 27 08/05/2022    Anesthesia Physical Anesthesia Plan  ASA: 2  Anesthesia Plan: Spinal and MAC   Post-op Pain Management: Tylenol PO (pre-op)* and Regional block*   Induction:   PONV Risk Score and Plan: 2 and Propofol infusion, Dexamethasone and Ondansetron  Airway Management Planned: Natural Airway and Simple Face Mask  Additional Equipment:   Intra-op Plan:   Post-operative Plan:   Informed Consent: I have reviewed the patients History and Physical, chart, labs and discussed the procedure including the risks, benefits and alternatives for the proposed anesthesia with the patient or authorized representative who has indicated his/her understanding and acceptance.     Dental advisory given  Plan Discussed with: CRNA  Anesthesia Plan Comments:          Anesthesia Quick Evaluation

## 2022-08-12 ENCOUNTER — Other Ambulatory Visit: Payer: Self-pay

## 2022-08-12 ENCOUNTER — Encounter (HOSPITAL_COMMUNITY)
Admission: RE | Disposition: A | Discharge status: Skilled Nursing Facility | Payer: Self-pay | Source: Home / Self Care | Attending: Specialist

## 2022-08-12 ENCOUNTER — Ambulatory Visit (HOSPITAL_BASED_OUTPATIENT_CLINIC_OR_DEPARTMENT_OTHER): Payer: PPO | Admitting: Anesthesiology

## 2022-08-12 ENCOUNTER — Ambulatory Visit (HOSPITAL_COMMUNITY): Payer: PPO | Admitting: Physician Assistant

## 2022-08-12 ENCOUNTER — Observation Stay (HOSPITAL_COMMUNITY)
Admission: RE | Admit: 2022-08-12 | Discharge: 2022-08-13 | Disposition: A | Discharge status: Skilled Nursing Facility | Payer: PPO | Attending: Specialist | Admitting: Specialist

## 2022-08-12 ENCOUNTER — Encounter (HOSPITAL_COMMUNITY): Payer: Self-pay | Admitting: Specialist

## 2022-08-12 DIAGNOSIS — Z01818 Encounter for other preprocedural examination: Secondary | ICD-10-CM

## 2022-08-12 DIAGNOSIS — M1712 Unilateral primary osteoarthritis, left knee: Principal | ICD-10-CM | POA: Diagnosis present

## 2022-08-12 DIAGNOSIS — E119 Type 2 diabetes mellitus without complications: Secondary | ICD-10-CM | POA: Insufficient documentation

## 2022-08-12 DIAGNOSIS — Z79899 Other long term (current) drug therapy: Secondary | ICD-10-CM | POA: Insufficient documentation

## 2022-08-12 DIAGNOSIS — Z96652 Presence of left artificial knee joint: Secondary | ICD-10-CM

## 2022-08-12 DIAGNOSIS — G8918 Other acute postprocedural pain: Secondary | ICD-10-CM | POA: Diagnosis not present

## 2022-08-12 DIAGNOSIS — Z96651 Presence of right artificial knee joint: Secondary | ICD-10-CM | POA: Diagnosis not present

## 2022-08-12 HISTORY — PX: TOTAL KNEE ARTHROPLASTY: SHX125

## 2022-08-12 LAB — TYPE AND SCREEN: ABO/RH(D): AB POS

## 2022-08-12 LAB — HIV ANTIBODY (ROUTINE TESTING W REFLEX): HIV Screen 4th Generation wRfx: NONREACTIVE

## 2022-08-12 SURGERY — ARTHROPLASTY, KNEE, TOTAL
Anesthesia: Monitor Anesthesia Care | Site: Knee | Laterality: Left

## 2022-08-12 MED ORDER — ORAL CARE MOUTH RINSE: 15.0000 mL | Freq: Once | OROMUCOSAL | Status: AC

## 2022-08-12 MED ORDER — CEFAZOLIN SODIUM-DEXTROSE 2-4 GM/100ML-% IV SOLN
2.0000 g | INTRAVENOUS | Status: AC
  Administered 2022-08-12: 2 g via INTRAVENOUS
  Filled 2022-08-12: qty 100

## 2022-08-12 MED ORDER — GLYCOPYRROLATE 0.2 MG/ML IJ SOLN
INTRAMUSCULAR | Status: AC
  Filled 2022-08-12: qty 1

## 2022-08-12 MED ORDER — FAMOTIDINE 20 MG PO TABS
20.0000 mg | ORAL_TABLET | Freq: Two times a day (BID) | ORAL | Status: DC
  Administered 2022-08-12 – 2022-08-13 (×2): 20 mg via ORAL
  Filled 2022-08-12 (×2): qty 1

## 2022-08-12 MED ORDER — FAMOTIDINE 20 MG PO TABS: 20.0000 mg | ORAL_TABLET | Freq: Two times a day (BID) | ORAL | Status: DC

## 2022-08-12 MED ORDER — CEFAZOLIN SODIUM-DEXTROSE 1-4 GM/50ML-% IV SOLN
1.0000 g | Freq: Four times a day (QID) | INTRAVENOUS | Status: AC
  Administered 2022-08-12 – 2022-08-13 (×3): 1 g via INTRAVENOUS
  Filled 2022-08-12 (×3): qty 50

## 2022-08-12 MED ORDER — CLONIDINE HCL (ANALGESIA) 100 MCG/ML EP SOLN
EPIDURAL | Status: DC | PRN
  Administered 2022-08-12: 50 ug

## 2022-08-12 MED ORDER — FENTANYL CITRATE (PF) 100 MCG/2ML IJ SOLN
INTRAMUSCULAR | Status: DC | PRN
  Administered 2022-08-12 (×2): 50 ug via INTRAVENOUS

## 2022-08-12 MED ORDER — SODIUM CHLORIDE 0.9 % IV SOLN: INTRAVENOUS | Status: DC

## 2022-08-12 MED ORDER — OXYCODONE HCL 5 MG PO TABS
10.0000 mg | ORAL_TABLET | ORAL | Status: DC | PRN
  Administered 2022-08-12 – 2022-08-13 (×4): 15 mg via ORAL
  Filled 2022-08-12 (×4): qty 3

## 2022-08-12 MED ORDER — MIDAZOLAM HCL 2 MG/2ML IJ SOLN
INTRAMUSCULAR | Status: AC
  Filled 2022-08-12: qty 2

## 2022-08-12 MED ORDER — METHOCARBAMOL 500 MG IVPB - SIMPLE MED
INTRAVENOUS | Status: AC
  Administered 2022-08-12: 500 mg via INTRAVENOUS
  Filled 2022-08-12: qty 55

## 2022-08-12 MED ORDER — PROPOFOL 10 MG/ML IV BOLUS
INTRAVENOUS | Status: AC
  Filled 2022-08-12: qty 20

## 2022-08-12 MED ORDER — MIDAZOLAM HCL 2 MG/2ML IJ SOLN
INTRAMUSCULAR | Status: DC | PRN
  Administered 2022-08-12: 2 mg via INTRAVENOUS

## 2022-08-12 MED ORDER — PROPOFOL 500 MG/50ML IV EMUL
INTRAVENOUS | Status: DC | PRN
  Administered 2022-08-12: 70 ug/kg/min via INTRAVENOUS

## 2022-08-12 MED ORDER — DEXMEDETOMIDINE HCL IN NACL 80 MCG/20ML IV SOLN
INTRAVENOUS | Status: AC
  Filled 2022-08-12: qty 20

## 2022-08-12 MED ORDER — PROPOFOL 1000 MG/100ML IV EMUL
INTRAVENOUS | Status: AC
  Filled 2022-08-12: qty 100

## 2022-08-12 MED ORDER — HYDROMORPHONE HCL 1 MG/ML IJ SOLN
0.5000 mg | INTRAMUSCULAR | Status: DC | PRN
  Administered 2022-08-12 – 2022-08-13 (×2): 1 mg via INTRAVENOUS
  Filled 2022-08-12 (×2): qty 1

## 2022-08-12 MED ORDER — TRANEXAMIC ACID-NACL 1000-0.7 MG/100ML-% IV SOLN
1000.0000 mg | INTRAVENOUS | Status: AC
  Administered 2022-08-12: 1000 mg via INTRAVENOUS
  Filled 2022-08-12: qty 100

## 2022-08-12 MED ORDER — ASPIRIN 81 MG PO CHEW
81.0000 mg | CHEWABLE_TABLET | Freq: Two times a day (BID) | ORAL | Status: DC
  Administered 2022-08-13: 81 mg via ORAL
  Filled 2022-08-12: qty 1

## 2022-08-12 MED ORDER — SODIUM CHLORIDE (PF) 0.9 % IJ SOLN
INTRAMUSCULAR | Status: AC
  Filled 2022-08-12: qty 50

## 2022-08-12 MED ORDER — SODIUM CHLORIDE (PF) 0.9 % IJ SOLN
INTRAMUSCULAR | Status: DC | PRN
  Administered 2022-08-12: 10 mL
  Administered 2022-08-12: 50 mL

## 2022-08-12 MED ORDER — ACETAMINOPHEN 500 MG PO TABS
1000.0000 mg | ORAL_TABLET | Freq: Four times a day (QID) | ORAL | Status: AC
  Administered 2022-08-12 – 2022-08-13 (×2): 1000 mg via ORAL
  Filled 2022-08-12 (×2): qty 2

## 2022-08-12 MED ORDER — CELECOXIB 200 MG PO CAPS: 200.0000 mg | ORAL_CAPSULE | Freq: Every day | ORAL | Status: DC

## 2022-08-12 MED ORDER — TRAMADOL HCL 50 MG PO TABS
50.0000 mg | ORAL_TABLET | Freq: Four times a day (QID) | ORAL | Status: DC
  Administered 2022-08-12 (×2): 50 mg via ORAL
  Filled 2022-08-12 (×2): qty 1

## 2022-08-12 MED ORDER — SENNOSIDES-DOCUSATE SODIUM 8.6-50 MG PO TABS: 1.0000 | ORAL_TABLET | Freq: Every evening | ORAL | Status: DC | PRN

## 2022-08-12 MED ORDER — 0.9 % SODIUM CHLORIDE (POUR BTL) OPTIME
TOPICAL | Status: DC | PRN
  Administered 2022-08-12: 1000 mL

## 2022-08-12 MED ORDER — METHOCARBAMOL 500 MG PO TABS
500.0000 mg | ORAL_TABLET | Freq: Four times a day (QID) | ORAL | Status: DC | PRN
  Administered 2022-08-12 – 2022-08-13 (×3): 500 mg via ORAL
  Filled 2022-08-12 (×3): qty 1

## 2022-08-12 MED ORDER — CELECOXIB 200 MG PO CAPS
200.0000 mg | ORAL_CAPSULE | Freq: Every day | ORAL | Status: DC
  Administered 2022-08-13: 200 mg via ORAL
  Filled 2022-08-12: qty 1

## 2022-08-12 MED ORDER — POVIDONE-IODINE 10 % EX SWAB
2.0000 | Freq: Once | CUTANEOUS | Status: AC
  Administered 2022-08-12: 2 via TOPICAL

## 2022-08-12 MED ORDER — DEXAMETHASONE SODIUM PHOSPHATE 10 MG/ML IJ SOLN
INTRAMUSCULAR | Status: AC
  Filled 2022-08-12: qty 1

## 2022-08-12 MED ORDER — ROPIVACAINE HCL 5 MG/ML IJ SOLN
INTRAMUSCULAR | Status: DC | PRN
  Administered 2022-08-12: 20 mL via PERINEURAL

## 2022-08-12 MED ORDER — AMISULPRIDE (ANTIEMETIC) 5 MG/2ML IV SOLN: 10.0000 mg | Freq: Once | INTRAVENOUS | Status: DC | PRN

## 2022-08-12 MED ORDER — LORATADINE 10 MG PO TABS
10.0000 mg | ORAL_TABLET | Freq: Every day | ORAL | Status: DC
  Administered 2022-08-13: 10 mg via ORAL
  Filled 2022-08-12: qty 1

## 2022-08-12 MED ORDER — ONDANSETRON HCL 4 MG PO TABS: 4.0000 mg | ORAL_TABLET | Freq: Four times a day (QID) | ORAL | Status: DC | PRN

## 2022-08-12 MED ORDER — PHENYLEPHRINE HCL-NACL 20-0.9 MG/250ML-% IV SOLN
INTRAVENOUS | Status: DC | PRN
  Administered 2022-08-12: 25 ug/min via INTRAVENOUS

## 2022-08-12 MED ORDER — SODIUM CHLORIDE (PF) 0.9 % IJ SOLN
INTRAMUSCULAR | Status: AC
  Filled 2022-08-12: qty 10

## 2022-08-12 MED ORDER — BISACODYL 5 MG PO TBEC: 5.0000 mg | DELAYED_RELEASE_TABLET | Freq: Every day | ORAL | Status: DC | PRN

## 2022-08-12 MED ORDER — ACETAMINOPHEN 325 MG PO TABS: 325.0000 mg | ORAL_TABLET | Freq: Four times a day (QID) | ORAL | Status: DC | PRN

## 2022-08-12 MED ORDER — LOPERAMIDE HCL 2 MG/15ML PO SOLN
2.0000 mg | ORAL | Status: DC | PRN
  Filled 2022-08-12: qty 15

## 2022-08-12 MED ORDER — FENTANYL CITRATE PF 50 MCG/ML IJ SOSY
PREFILLED_SYRINGE | INTRAMUSCULAR | Status: AC
  Administered 2022-08-12: 50 ug via INTRAVENOUS
  Filled 2022-08-12: qty 3

## 2022-08-12 MED ORDER — ACETAMINOPHEN 500 MG PO TABS
1000.0000 mg | ORAL_TABLET | ORAL | Status: DC | PRN
  Administered 2022-08-13: 1000 mg via ORAL
  Filled 2022-08-12: qty 2

## 2022-08-12 MED ORDER — CELECOXIB 200 MG PO CAPS
ORAL_CAPSULE | ORAL | Status: AC
  Administered 2022-08-12: 200 mg via ORAL
  Filled 2022-08-12: qty 1

## 2022-08-12 MED ORDER — ONDANSETRON HCL 4 MG/2ML IJ SOLN
4.0000 mg | Freq: Four times a day (QID) | INTRAMUSCULAR | Status: DC | PRN
  Administered 2022-08-13: 4 mg via INTRAVENOUS
  Filled 2022-08-12: qty 2

## 2022-08-12 MED ORDER — SODIUM CHLORIDE 0.9 % IR SOLN
Status: DC | PRN
  Administered 2022-08-12: 3000 mL

## 2022-08-12 MED ORDER — LIDOCAINE 2% (20 MG/ML) 5 ML SYRINGE
INTRAMUSCULAR | Status: DC | PRN
  Administered 2022-08-12: 60 mg via INTRAVENOUS

## 2022-08-12 MED ORDER — CHLORHEXIDINE GLUCONATE 0.12 % MT SOLN
15.0000 mL | Freq: Once | OROMUCOSAL | Status: AC
  Administered 2022-08-12: 15 mL via OROMUCOSAL

## 2022-08-12 MED ORDER — BUPIVACAINE IN DEXTROSE 0.75-8.25 % IT SOLN
INTRATHECAL | Status: DC | PRN
  Administered 2022-08-12: 1.6 mL via INTRATHECAL

## 2022-08-12 MED ORDER — ACETAMINOPHEN 500 MG PO TABS
1000.0000 mg | ORAL_TABLET | Freq: Once | ORAL | Status: AC
  Administered 2022-08-12: 1000 mg via ORAL
  Filled 2022-08-12: qty 2

## 2022-08-12 MED ORDER — BUPIVACAINE LIPOSOME 1.3 % IJ SUSP
INTRAMUSCULAR | Status: DC | PRN
  Administered 2022-08-12: 20 mL

## 2022-08-12 MED ORDER — FENTANYL CITRATE (PF) 100 MCG/2ML IJ SOLN
INTRAMUSCULAR | Status: AC
  Filled 2022-08-12: qty 2

## 2022-08-12 MED ORDER — MAGNESIUM CITRATE PO SOLN: 1.0000 | Freq: Once | ORAL | Status: DC | PRN

## 2022-08-12 MED ORDER — BUPIVACAINE LIPOSOME 1.3 % IJ SUSP
INTRAMUSCULAR | Status: AC
  Filled 2022-08-12: qty 20

## 2022-08-12 MED ORDER — LACTATED RINGERS IV SOLN: INTRAVENOUS | Status: DC

## 2022-08-12 MED ORDER — OXYCODONE HCL 5 MG PO TABS
5.0000 mg | ORAL_TABLET | ORAL | Status: DC | PRN
  Administered 2022-08-12: 10 mg via ORAL
  Filled 2022-08-12: qty 2

## 2022-08-12 MED ORDER — FENTANYL CITRATE PF 50 MCG/ML IJ SOSY
25.0000 ug | PREFILLED_SYRINGE | INTRAMUSCULAR | Status: DC | PRN
  Administered 2022-08-12 (×2): 50 ug via INTRAVENOUS

## 2022-08-12 MED ORDER — BUPIVACAINE LIPOSOME 1.3 % IJ SUSP: 20.0000 mL | Freq: Once | INTRAMUSCULAR | Status: DC

## 2022-08-12 MED ORDER — METHOCARBAMOL 500 MG IVPB - SIMPLE MED
500.0000 mg | Freq: Four times a day (QID) | INTRAVENOUS | Status: DC | PRN
  Filled 2022-08-12: qty 55

## 2022-08-12 MED ORDER — DEXAMETHASONE SODIUM PHOSPHATE 10 MG/ML IJ SOLN: 8.0000 mg | Freq: Once | INTRAMUSCULAR | Status: DC

## 2022-08-12 MED ORDER — DEXAMETHASONE SODIUM PHOSPHATE 10 MG/ML IJ SOLN
INTRAMUSCULAR | Status: DC | PRN
  Administered 2022-08-12: 10 mg via INTRAVENOUS

## 2022-08-12 MED ORDER — ONDANSETRON HCL 4 MG/2ML IJ SOLN
INTRAMUSCULAR | Status: AC
  Filled 2022-08-12: qty 2

## 2022-08-12 SURGICAL SUPPLY — 66 items
ADH SKN CLS APL DERMABOND .7 (GAUZE/BANDAGES/DRESSINGS) ×2
ATTUNE PSFEM LTSZ5 NARCEM KNEE (Femur) IMPLANT
ATTUNE PSRP INSR SZ5 8 KNEE (Insert) IMPLANT
BAG COUNTER SPONGE SURGICOUNT (BAG) ×1 IMPLANT
BAG SPEC THK2 15X12 ZIP CLS (MISCELLANEOUS) ×1
BAG SPNG CNTER NS LX DISP (BAG) ×1
BAG ZIPLOCK 12X15 (MISCELLANEOUS) ×1 IMPLANT
BASE TIBIAL ROT PLAT SZ 5 KNEE (Knees) IMPLANT
BLADE SAG 18X100X1.27 (BLADE) ×1 IMPLANT
BLADE SAW SGTL 11.0X1.19X90.0M (BLADE) ×1 IMPLANT
BNDG CMPR 5X4 KNIT ELC UNQ LF (GAUZE/BANDAGES/DRESSINGS) ×1
BNDG CMPR 5X62 HK CLSR LF (GAUZE/BANDAGES/DRESSINGS) ×1
BNDG CMPR MED 10X6 ELC LF (GAUZE/BANDAGES/DRESSINGS) ×1
BNDG CMPR STD VLCR NS LF 5.8X4 (GAUZE/BANDAGES/DRESSINGS) ×1
BNDG ELASTIC 4INX 5YD STR LF (GAUZE/BANDAGES/DRESSINGS) ×1 IMPLANT
BNDG ELASTIC 4X5.8 VLCR NS LF (GAUZE/BANDAGES/DRESSINGS) IMPLANT
BNDG ELASTIC 6INX 5YD STR LF (GAUZE/BANDAGES/DRESSINGS) ×1 IMPLANT
BNDG ELASTIC 6X10 VLCR STRL LF (GAUZE/BANDAGES/DRESSINGS) IMPLANT
BONE CEMENT GENTAMICIN (Cement) ×2 IMPLANT
BOWL SMART MIX CTS (DISPOSABLE) ×1 IMPLANT
BSPLAT TIB 5 CMNT ROT PLAT STR (Knees) ×1 IMPLANT
CEMENT BONE GENTAMICIN 40 (Cement) IMPLANT
COVER SURGICAL LIGHT HANDLE (MISCELLANEOUS) ×1 IMPLANT
CUFF TOURN SGL QUICK 34 (TOURNIQUET CUFF) ×1
CUFF TRNQT CYL 34X4.125X (TOURNIQUET CUFF) ×1 IMPLANT
DERMABOND ADVANCED .7 DNX12 (GAUZE/BANDAGES/DRESSINGS) ×1 IMPLANT
DRAPE INCISE IOBAN 66X45 STRL (DRAPES) ×1 IMPLANT
DRAPE U-SHAPE 47X51 STRL (DRAPES) ×1 IMPLANT
DRSG AQUACEL AG ADV 3.5X10 (GAUZE/BANDAGES/DRESSINGS) ×1 IMPLANT
DURAPREP 26ML APPLICATOR (WOUND CARE) ×2 IMPLANT
ELECT NDL BLADE 2-5/6 (NEEDLE) ×1 IMPLANT
ELECT NEEDLE BLADE 2-5/6 (NEEDLE) ×1 IMPLANT
ELECT REM PT RETURN 15FT ADLT (MISCELLANEOUS) ×1 IMPLANT
GLOVE BIOGEL PI IND STRL 7.0 (GLOVE) ×1 IMPLANT
GLOVE BIOGEL PI IND STRL 8 (GLOVE) ×1 IMPLANT
GLOVE ECLIPSE 8.0 STRL XLNG CF (GLOVE) ×1 IMPLANT
GLOVE SURG ORTHO 9.0 STRL STRW (GLOVE) ×1 IMPLANT
GLOVE SURG SS PI 7.0 STRL IVOR (GLOVE) ×1 IMPLANT
GOWN SPEC L4 XLG W/TWL (GOWN DISPOSABLE) ×2 IMPLANT
HANDPIECE INTERPULSE COAX TIP (DISPOSABLE) ×1
KIT TURNOVER KIT A (KITS) IMPLANT
NS IRRIG 1000ML POUR BTL (IV SOLUTION) ×1 IMPLANT
PACK TOTAL KNEE CUSTOM (KITS) ×1 IMPLANT
PATELLA MEDIAL ATTUN 35MM KNEE (Knees) IMPLANT
PIN STEINMAN FIXATION KNEE (PIN) IMPLANT
PROTECTOR NERVE ULNAR (MISCELLANEOUS) ×1 IMPLANT
SET HNDPC FAN SPRY TIP SCT (DISPOSABLE) ×1 IMPLANT
SET PAD KNEE POSITIONER (MISCELLANEOUS) ×1 IMPLANT
SOLUTION PRONTOSAN WOUND 350ML (IRRIGATION / IRRIGATOR) ×1 IMPLANT
SPIKE FLUID TRANSFER (MISCELLANEOUS) ×1 IMPLANT
SPONGE SURGIFOAM ABS GEL 100 (HEMOSTASIS) ×1 IMPLANT
STOCKINETTE 6  STRL (DRAPES) ×1
STOCKINETTE 6 STRL (DRAPES) ×1 IMPLANT
SUT MNCRL AB 3-0 PS2 18 (SUTURE) ×1 IMPLANT
SUT VIC AB 1 CT1 27 (SUTURE) ×3
SUT VIC AB 1 CT1 27XBRD ANTBC (SUTURE) ×3 IMPLANT
SUT VIC AB 2-0 CT1 27 (SUTURE) ×2
SUT VIC AB 2-0 CT1 TAPERPNT 27 (SUTURE) ×2 IMPLANT
SUT VLOC 180 0 24IN GS25 (SUTURE) ×1 IMPLANT
SYR 50ML LL SCALE MARK (SYRINGE) ×1 IMPLANT
TAPE STRIPS DRAPE STRL (GAUZE/BANDAGES/DRESSINGS) ×1 IMPLANT
TIBIAL BASE ROT PLAT SZ 5 KNEE (Knees) ×1 IMPLANT
TRAY CATH INTERMITTENT SS 16FR (CATHETERS) ×1 IMPLANT
TUBE SUCTION HIGH CAP CLEAR NV (SUCTIONS) ×1 IMPLANT
WATER STERILE IRR 1000ML POUR (IV SOLUTION) ×2 IMPLANT
WRAP KNEE MAXI GEL POST OP (GAUZE/BANDAGES/DRESSINGS) IMPLANT

## 2022-08-12 NOTE — Anesthesia Procedure Notes (Signed)
Anesthesia Regional Block: Adductor canal block   Pre-Anesthetic Checklist: , timeout performed,  Correct Patient, Correct Site, Correct Laterality,  Correct Procedure, Correct Position, site marked,  Risks and benefits discussed,  Surgical consent,  Pre-op evaluation,  At surgeon's request and post-op pain management  Laterality: Left  Prep: chloraprep       Needles:  Injection technique: Single-shot  Needle Type: Echogenic Needle     Needle Length: 9cm  Needle Gauge: 21     Additional Needles:   Procedures:,,,, ultrasound used (permanent image in chart),,    Narrative:  Start time: 08/12/2022 6:52 AM End time: 08/12/2022 6:59 AM Injection made incrementally with aspirations every 5 mL.  Performed by: Personally  Anesthesiologist: Marcene Duos, MD

## 2022-08-12 NOTE — Anesthesia Procedure Notes (Signed)
Spinal  Patient location during procedure: OR Start time: 08/12/2022 8:00 AM End time: 08/12/2022 8:06 AM Reason for block: surgical anesthesia Staffing Performed: anesthesiologist  Anesthesiologist: Marcene Duos, MD Performed by: Marcene Duos, MD Authorized by: Marcene Duos, MD   Preanesthetic Checklist Completed: patient identified, IV checked, site marked, risks and benefits discussed, surgical consent, monitors and equipment checked, pre-op evaluation and timeout performed Spinal Block Patient position: sitting Prep: DuraPrep Patient monitoring: heart rate, cardiac monitor, continuous pulse ox and blood pressure Approach: midline Location: L3-4 Injection technique: single-shot Needle Needle type: Pencan  Needle gauge: 24 G Needle length: 9 cm Assessment Sensory level: T4 Events: CSF return and second provider

## 2022-08-12 NOTE — Anesthesia Postprocedure Evaluation (Signed)
Anesthesia Post Note  Patient: Tammy [name]  Procedure(s) Performed: TOTAL KNEE ARTHROPLASTY (Left: Knee)     Patient location during evaluation: PACU Anesthesia Type: Spinal Level of consciousness: awake and alert Pain management: pain level controlled Vital Signs Assessment: post-procedure vital signs reviewed and stable Respiratory status: spontaneous breathing and respiratory function stable Cardiovascular status: blood pressure returned to baseline and stable Postop Assessment: spinal receding Anesthetic complications: no  No notable events documented.  Last Vitals:  Vitals:   08/12/22 1238 08/12/22 1445  BP: 138/84 (!) 156/79  Pulse: (!) 58 85  Resp: 20 18  Temp: 36.9 C (!) 36.3 C  SpO2: 98% 97%    Last Pain:  Vitals:   08/12/22 1615  TempSrc:   PainSc: 3                  Kennieth Rad

## 2022-08-12 NOTE — Interval H&P Note (Signed)
History and Physical Interval Note:  08/12/2022 7:47 AM  Tammy [name]  has presented today for surgery, with the diagnosis of Left knee osteoarthritits.  The various methods of treatment have been discussed with the patient and family. After consideration of risks, benefits and other options for treatment, the patient has consented to  Procedure(s) with comments: TOTAL KNEE ARTHROPLASTY (Left) - adductor canal  130 as a surgical intervention.  The patient's history has been reviewed, patient examined, no change in status, stable for surgery.  I have reviewed the patient's chart and labs.  Questions were answered to the patient's satisfaction.     Emilygrace Grothe ANDREW

## 2022-08-12 NOTE — Transfer of Care (Signed)
Immediate Anesthesia Transfer of Care Note  Patient: Tammy [name]  Procedure(s) Performed: Procedure(s) with comments: TOTAL KNEE ARTHROPLASTY (Left) - adductor canal  130  Patient Location: PACU  Anesthesia Type:Spinal  Level of Consciousness: awake, alert  and oriented  Airway & Oxygen Therapy: Patient Spontanous Breathing  Post-op Assessment: Report given to RN and Post -op Vital signs reviewed and stable  Post vital signs: Reviewed and stable  Last Vitals:  Vitals:   08/12/22 0556  BP: (!) 148/85  Pulse: 73  Resp: 16  Temp: 37 C  SpO2: 97%    Complications: No apparent anesthesia complications

## 2022-08-12 NOTE — Op Note (Signed)
DATE OF SURGERY:  08/12/2022  TIME: 12:55 PM  PATIENT NAME:  Tammy [name]    AGE: 67 y.o.   PRE-OPERATIVE DIAGNOSIS:  Left knee osteoarthritits  POST-OPERATIVE DIAGNOSIS:  Left knee osteoarthritits  PROCEDURE:  Procedure(s): TOTAL KNEE ARTHROPLASTY  SURGEON:  Lamonda Noxon ANDREW  ASSISTANT:  Leeanne Haus, PA-C, present and scrubbed throughout the case, critical for assistance with exposure, retraction, instrumentation, and closure.  OPERATIVE IMPLANTS: Depuy PFC Attune Rotating Platform.  Femur size 8, Tibia size 8, Patella size 41 3-peg oval button, with a 6 mm polyethylene insert.   PREOPERATIVE INDICATIONS:   Tammy [name] is a 67 y.o. year old female with end stage bone on bone arthritis of the knee who failed conservative treatment and elected for Total Knee Arthroplasty.   The risks, benefits, and alternatives were discussed at length including but not limited to the risks of infection, bleeding, nerve injury, stiffness, blood clots, the need for revision surgery, cardiopulmonary complications, among others, and they were willing to proceed.  OPERATIVE DESCRIPTION:  The patient was brought to the operative room and placed in a supine position.  Spinal anesthesia was administered.  IV antibiotics were given.  The lower extremity was prepped and draped in the usual sterile fashion.  Time out was performed.  The leg was elevated and exsanguinated and the tourniquet was inflated.  Anterior quadriceps tendon splitting approach was performed.  The patella was retracted and osteophytes were removed.  The anterior horn of the medial and lateral meniscus was removed and cruciate ligaments resected.   The distal femur was opened with the drill and the intramedullary distal femoral cutting jig was utilized, set at 5 degrees resecting 10 mm off the distal femur.  Care was taken to protect the collateral ligaments.  The distal femoral sizing jig was applied, taking care to  avoid notching.  Then the 4-in-1 cutting jig was applied and the anterior and posterior femur was cut, along with the chamfer cuts.    Then the extramedullary tibial cutting jig was utilized making the appropriate cut using the anterior tibial crest as a reference building in appropriate posterior slope.  Care was taken during the cut to protect the medial and collateral ligaments.  The proximal tibia was removed along with the posterior horns of the menisci.   The posterior medial femoral osteophytes and posterior lateral femoral osteophytes were removed.    The flexion gap was then measured and was symmetric with the extension gap, measured at 6.  I completed the distal femoral preparation using the appropriate jig to prepare the box.  The patella was then measured, and cut with the saw.    The proximal tibia sized and prepared accordingly with the reamer and the punch, and then all components were trialed with the trial insert.  The knee was found to have excellent balance and full motion.    The above named components were then cemented into place and all excess cement was removed.  The trial polyethylene component was in place during cementation, and then was exchanged for the real polyethylene component.    The knee was easily taken through a range of motion and the patella tracked well and the knee irrigated copiously and the parapatellar and subcutaneous tissue closed with vicryl, and monocryl with steri strips for the skin.  The arthrotomy was closed at 90 of flexion. The wounds were dressed with sterile gauze and the tourniquet released and the patient was awakened and returned to the PACU in  stable and satisfactory condition.  There were no complications.  Total tourniquet time was 78 minutes.

## 2022-08-12 NOTE — Evaluation (Signed)
Physical Therapy Evaluation Patient Details Name: Tammy [name] MRN: [id number] DOB: 1955-12-19 Today's Date: 08/12/2022  History of Present Illness  Pt is 67 yo female admitted for L TKA on 08/12/22.  Pt with hx including but not limited to OA, obesity, DM, back pain with lumbar sx, R TKA.  Clinical Impression  Pt is s/p TKA resulting in the deficits listed below (see PT Problem List). At baseline, pt is independent but was using rollator for community ambulation.  She lives with spouse and sister in town for 20 days to assist as needed.  Pt reports he plan is to go to SNF, [facility], at d/c prior to home.  Today, pt with reports of fair pain control but tolerated therapy well.  She was light min A with transfers and ambulated 35' with RW. Did require cues for gait and transfer technique.  Pt expected to progress well with therapy.  Pt will benefit from acute skilled PT to increase their independence and safety with mobility to allow discharge.         Recommendations for follow up therapy are one component of a multi-disciplinary discharge planning process, led by the attending physician.  Recommendations may be updated based on patient status, additional functional criteria and insurance authorization.  Follow Up Recommendations       Assistance Recommended at Discharge Intermittent Supervision/Assistance  Patient can return home with the following  A little help with walking and/or transfers;A little help with bathing/dressing/bathroom;Assistance with cooking/housework;Help with stairs or ramp for entrance    Equipment Recommendations None recommended by PT  Recommendations for Other Services       Functional Status Assessment Patient has had a recent decline in their functional status and demonstrates the ability to make significant improvements in function in a reasonable and predictable amount of time.     Precautions / Restrictions Precautions Precautions:  Fall Restrictions Weight Bearing Restrictions: Yes LLE Weight Bearing: Weight bearing as tolerated      Mobility  Bed Mobility Overal bed mobility: Needs Assistance Bed Mobility: Supine to Sit     Supine to sit: Min assist     General bed mobility comments: assist for L LE    Transfers Overall transfer level: Needs assistance Equipment used: Rolling walker (2 wheels) Transfers: Sit to/from Stand Sit to Stand: Min assist           General transfer comment: cues for hand placement and L LE managment with light min A to steady    Ambulation/Gait Ambulation/Gait assistance: Min guard Gait Distance (Feet): 50 Feet Assistive device: Rolling walker (2 wheels) Gait Pattern/deviations: Step-to pattern, Decreased stride length, Decreased weight shift to left Gait velocity: decreased     General Gait Details: Cues for RW proximity (frequently getting too close)  Careers information officer    Modified Rankin (Stroke Patients Only)       Balance Overall balance assessment: Needs assistance Sitting-balance support: No upper extremity supported Sitting balance-Leahy Scale: Good     Standing balance support: No upper extremity supported, Bilateral upper extremity supported Standing balance-Leahy Scale: Fair Standing balance comment: RW to ambulate but able to static stand without support                             Pertinent Vitals/Pain Pain Assessment Pain Assessment: 0-10 Pain Score: 6  Pain Location: L knee Pain Descriptors / Indicators: Discomfort,  Sore Pain Intervention(s): Limited activity within patient's tolerance, Monitored during session, Premedicated before session, Repositioned, Ice applied    Home Living Family/patient expects to be discharged to:: Private residence Living Arrangements: Spouse/significant other Available Help at Discharge: Other (Comment) (reports planning on going to SNF) Type of Home: House  (townhouse) Home Access: Stairs to enter Entrance Stairs-Rails: Right Entrance Stairs-Number of Steps: 4   Home Layout: Laundry or work area in basement;Able to live on main level with bedroom/bathroom Home Equipment: Rollator (4 wheels);Cane - single point;Transport chair;BSC/3in1      Prior Function Prior Level of Function : Independent/Modified Independent;Driving             Mobility Comments: Could ambulate in community but used rollator ADLs Comments: reports independent with adls and iadls     Hand Dominance        Extremity/Trunk Assessment   Upper Extremity Assessment Upper Extremity Assessment: Overall WFL for tasks assessed    Lower Extremity Assessment Lower Extremity Assessment: LLE deficits/detail;RLE deficits/detail RLE Deficits / Details: ROM WFL; MMT 5/5 LLE Deficits / Details: Expected post op changes; ROM: knee 5 to 90 degrees; MMT: ankle 5/5, knee and hip 2/5    Cervical / Trunk Assessment Cervical / Trunk Assessment: Normal  Communication   Communication: No difficulties  Cognition Arousal/Alertness: Awake/alert Behavior During Therapy: WFL for tasks assessed/performed Overall Cognitive Status: Within Functional Limits for tasks assessed                                          General Comments General comments (skin integrity, edema, etc.): VSS    Exercises     Assessment/Plan    PT Assessment Patient needs continued PT services  PT Problem List Decreased strength;Pain;Decreased range of motion;Decreased activity tolerance;Decreased balance;Decreased mobility;Decreased knowledge of precautions;Decreased knowledge of use of DME       PT Treatment Interventions DME instruction;Therapeutic exercise;Gait training;Balance training;Stair training;Functional mobility training;Therapeutic activities;Patient/family education;Modalities    PT Goals (Current goals can be found in the Care Plan section)  Acute Rehab PT  Goals Patient Stated Goal: Reports going for rehab at [facility] SNF then home PT Goal Formulation: With patient/family Time For Goal Achievement: 08/26/22 Potential to Achieve Goals: Good    Frequency 7X/week     Co-evaluation               AM-PAC PT "6 Clicks" Mobility  Outcome Measure Help needed turning from your back to your side while in a flat bed without using bedrails?: A Little Help needed moving from lying on your back to sitting on the side of a flat bed without using bedrails?: A Little Help needed moving to and from a bed to a chair (including a wheelchair)?: A Little Help needed standing up from a chair using your arms (e.g., wheelchair or bedside chair)?: A Little Help needed to walk in hospital room?: A Little Help needed climbing 3-5 steps with a railing? : A Little 6 Click Score: 18    End of Session Equipment Utilized During Treatment: Gait belt Activity Tolerance: Patient tolerated treatment well Patient left: with chair alarm set;in chair;with call bell/phone within reach;with family/visitor present Nurse Communication: Mobility status PT Visit Diagnosis: Other abnormalities of gait and mobility (R26.89);Muscle weakness (generalized) (M62.81)    Time: 4098-1191 PT Time Calculation (min) (ACUTE ONLY): 25 min   Charges:   PT Evaluation $PT Eval Low  Complexity: 1 Low PT Treatments $Gait Training: 8-22 mins        Anise Salvo, PT Acute Rehab [facility] Rehab [phone]   Rayetta Humphrey 08/12/2022, 5:08 PM

## 2022-08-12 NOTE — Plan of Care (Signed)

## 2022-08-12 NOTE — Op Note (Signed)
DATE OF SURGERY:  08/12/2022  TIME: 10:03 AM  PATIENT NAME:  Tammy [name]    AGE: 67 y.o.   PRE-OPERATIVE DIAGNOSIS:  Left knee osteoarthritits  POST-OPERATIVE DIAGNOSIS:  Left knee osteoarthritits  PROCEDURE:  Procedure(s): TOTAL KNEE ARTHROPLASTY  SURGEON:  Johnston Maddocks ANDREW  ASSISTANT:  Leeanne Haus, PA-C, present and scrubbed throughout the case, critical for assistance with exposure, retraction, instrumentation, and closure.  OPERATIVE IMPLANTS: Depuy PFC Attune Rotating Platform.  Femur size 5, Tibia size 5, Patella size 35 3-peg oval button, with a 8 mm polyethylene insert.   PREOPERATIVE INDICATIONS:   Tammy [name] is a 67 y.o. year old female with end stage bone on bone arthritis of the knee who failed conservative treatment and elected for Total Knee Arthroplasty.   The risks, benefits, and alternatives were discussed at length including but not limited to the risks of infection, bleeding, nerve injury, stiffness, blood clots, the need for revision surgery, cardiopulmonary complications, among others, and they were willing to proceed.  OPERATIVE DESCRIPTION:  The patient was brought to the operative room and placed in a supine position.  Spinal anesthesia was administered.  IV antibiotics were given.  The lower extremity was prepped and draped in the usual sterile fashion.  Time out was performed.  The leg was elevated and exsanguinated and the tourniquet was inflated.  Anterior quadriceps tendon splitting approach was performed.  The patella was retracted and osteophytes were removed.  The anterior horn of the medial and lateral meniscus was removed and cruciate ligaments resected.   The distal femur was opened with the drill and the intramedullary distal femoral cutting jig was utilized, set at 5 degrees resecting 10 mm off the distal femur.  Care was taken to protect the collateral ligaments.  The distal femoral sizing jig was applied, taking care to  avoid notching.  Then the 4-in-1 cutting jig was applied and the anterior and posterior femur was cut, along with the chamfer cuts.    Then the extramedullary tibial cutting jig was utilized making the appropriate cut using the anterior tibial crest as a reference building in appropriate posterior slope.  Care was taken during the cut to protect the medial and collateral ligaments.  The proximal tibia was removed along with the posterior horns of the menisci.   The posterior medial femoral osteophytes and posterior lateral femoral osteophytes were removed.    The flexion gap was then measured and was symmetric with the extension gap, measured at 8.  I completed the distal femoral preparation using the appropriate jig to prepare the box.  The patella was then measured, and cut with the saw.    The proximal tibia sized and prepared accordingly with the reamer and the punch, and then all components were trialed with the trial insert.  The knee was found to have excellent balance and full motion.    The above named components were then cemented into place and all excess cement was removed.  The trial polyethylene component was in place during cementation, and then was exchanged for the real polyethylene component.    The knee was easily taken through a range of motion and the patella tracked well and the knee irrigated copiously and the parapatellar and subcutaneous tissue closed with vicryl, and monocryl with steri strips for the skin.  The arthrotomy was closed at 90 of flexion. The wounds were dressed with sterile gauze and the tourniquet released and the patient was awakened and returned to the PACU in  stable and satisfactory condition.  There were no complications.  Total tourniquet time was 80 minutes.

## 2022-08-13 ENCOUNTER — Encounter (HOSPITAL_COMMUNITY): Payer: Self-pay | Admitting: Specialist

## 2022-08-13 DIAGNOSIS — Z96652 Presence of left artificial knee joint: Secondary | ICD-10-CM | POA: Diagnosis not present

## 2022-08-13 DIAGNOSIS — R293 Abnormal posture: Secondary | ICD-10-CM | POA: Diagnosis not present

## 2022-08-13 DIAGNOSIS — R2689 Other abnormalities of gait and mobility: Secondary | ICD-10-CM | POA: Diagnosis not present

## 2022-08-13 DIAGNOSIS — M6281 Muscle weakness (generalized): Secondary | ICD-10-CM | POA: Diagnosis not present

## 2022-08-13 DIAGNOSIS — M549 Dorsalgia, unspecified: Secondary | ICD-10-CM | POA: Diagnosis not present

## 2022-08-13 DIAGNOSIS — R41841 Cognitive communication deficit: Secondary | ICD-10-CM | POA: Diagnosis not present

## 2022-08-13 DIAGNOSIS — M1712 Unilateral primary osteoarthritis, left knee: Secondary | ICD-10-CM | POA: Diagnosis not present

## 2022-08-13 DIAGNOSIS — K219 Gastro-esophageal reflux disease without esophagitis: Secondary | ICD-10-CM | POA: Diagnosis not present

## 2022-08-13 DIAGNOSIS — Z741 Need for assistance with personal care: Secondary | ICD-10-CM | POA: Diagnosis not present

## 2022-08-13 LAB — CBC
HCT: 37.8 % (ref 36.0–46.0)
Hemoglobin: 12.5 g/dL (ref 12.0–15.0)
MCH: 31.5 pg (ref 26.0–34.0)
MCHC: 33.1 g/dL (ref 30.0–36.0)
MCV: 95.2 fL (ref 80.0–100.0)
Platelets: 156 10*3/uL (ref 150–400)
RBC: 3.97 MIL/uL (ref 3.87–5.11)
RDW: 13.2 % (ref 11.5–15.5)
WBC: 10.9 10*3/uL — ABNORMAL HIGH (ref 4.0–10.5)
nRBC: 0 % (ref 0.0–0.2)

## 2022-08-13 LAB — BASIC METABOLIC PANEL
Anion gap: 9 (ref 5–15)
BUN: 13 mg/dL (ref 8–23)
CO2: 26 mmol/L (ref 22–32)
Calcium: 8.4 mg/dL — ABNORMAL LOW (ref 8.9–10.3)
Chloride: 100 mmol/L (ref 98–111)
Creatinine, Ser: 0.61 mg/dL (ref 0.44–1.00)
GFR, Estimated: >60 mL/min (ref >60)
Glucose, Bld: 151 mg/dL — ABNORMAL HIGH (ref 70–99)
Potassium: 4.4 mmol/L (ref 3.5–5.1)
Sodium: 135 mmol/L (ref 135–145)

## 2022-08-13 MED ORDER — TRAMADOL HCL 50 MG PO TABS: 50.0000 mg | ORAL_TABLET | Freq: Four times a day (QID) | ORAL | 0 refills | Status: DC

## 2022-08-13 MED ORDER — OXYCODONE HCL 5 MG PO TABS: 5.0000 mg | ORAL_TABLET | ORAL | 0 refills | Status: DC | PRN

## 2022-08-13 MED ORDER — ASPIRIN 81 MG PO CHEW: 81.0000 mg | CHEWABLE_TABLET | Freq: Two times a day (BID) | ORAL | 0 refills | Status: AC

## 2022-08-13 MED ORDER — ONDANSETRON HCL 4 MG PO TABS: 4.0000 mg | ORAL_TABLET | Freq: Four times a day (QID) | ORAL | 0 refills | Status: DC | PRN

## 2022-08-13 MED ORDER — METHOCARBAMOL 500 MG PO TABS: 500.0000 mg | ORAL_TABLET | Freq: Four times a day (QID) | ORAL | 0 refills | Status: DC | PRN

## 2022-08-13 NOTE — Progress Notes (Signed)
Physical Therapy Treatment Patient Details Name: Tammy [name] MRN: [id number] DOB: [birth year] Today's Date: 08/13/2022   History of Present Illness Pt is 67 yo female admitted for L TKA on 08/12/22.  Pt with hx including but not limited to OA, obesity, DM, back pain with lumbar sx, R TKA.    PT Comments    Pt is progressing well with mobility, she ambulated 120' with RW, no loss of balance, no buckling of L knee. Instructed pt/sister in TKA HEP, they demonstrate good understanding of correct technique.    Recommendations for follow up therapy are one component of a multi-disciplinary discharge planning process, led by the attending physician.  Recommendations may be updated based on patient status, additional functional criteria and insurance authorization.  Follow Up Recommendations       Assistance Recommended at Discharge Intermittent Supervision/Assistance  Patient can return home with the following A little help with walking and/or transfers;A little help with bathing/dressing/bathroom;Assistance with cooking/housework;Help with stairs or ramp for entrance   Equipment Recommendations  None recommended by PT    Recommendations for Other Services       Precautions / Restrictions Precautions Precautions: Fall; knee Reviewed no pillow under knee Restrictions Weight Bearing Restrictions: Yes LLE Weight Bearing: Weight bearing as tolerated     Mobility  Bed Mobility Overal bed mobility: Modified Independent Bed Mobility: Supine to Sit     Supine to sit: Modified independent (Device/Increase time), HOB elevated     General bed mobility comments: HOB up, used bedrail    Transfers Overall transfer level: Needs assistance Equipment used: Rolling walker (2 wheels) Transfers: Sit to/from Stand Sit to Stand: Supervision           General transfer comment: cues for hand placement and L LE managment    Ambulation/Gait Ambulation/Gait assistance:  Supervision Gait Distance (Feet): 120 Feet Assistive device: Rolling walker (2 wheels) Gait Pattern/deviations: Step-to pattern, Decreased stride length, Decreased weight shift to left Gait velocity: decreased     General Gait Details: steady, no loss of balance, good sequencing   Stairs             Wheelchair Mobility    Modified Rankin (Stroke Patients Only)       Balance Overall balance assessment: Needs assistance Sitting-balance support: No upper extremity supported Sitting balance-Leahy Scale: Good     Standing balance support: No upper extremity supported, Bilateral upper extremity supported Standing balance-Leahy Scale: Fair Standing balance comment: RW to ambulate but able to static stand without support                            Cognition Arousal/Alertness: Awake/alert Behavior During Therapy: WFL for tasks assessed/performed Overall Cognitive Status: Within Functional Limits for tasks assessed                                          Exercises Total Joint Exercises Ankle Circles/Pumps: AROM, Both, 10 reps, Supine Quad Sets: AROM, Both, 5 reps, Supine Short Arc Quad: AROM, Left, 10 reps, Supine Heel Slides: AAROM, Left, 5 reps, Supine Hip ABduction/ADduction: AAROM, Left, 10 reps, Supine Straight Leg Raises: AAROM, Left, 5 reps, Supine Long Arc Quad: AROM, Left, 10 reps, Seated Knee Flexion: AAROM, Left, 10 reps, Seated Goniometric ROM: 0-60* AAROM L knee    General Comments  Pertinent Vitals/Pain Pain Assessment Pain Score: 7  Pain Location: L knee with walking Pain Descriptors / Indicators: Discomfort, Sore Pain Intervention(s): Limited activity within patient's tolerance, Monitored during session, Premedicated before session, Ice applied    Home Living                          Prior Function            PT Goals (current goals can now be found in the care plan section) Acute Rehab PT  Goals Patient Stated Goal: Reports going for rehab at [facility] SNF then home PT Goal Formulation: With patient/family Time For Goal Achievement: 08/26/22 Potential to Achieve Goals: Good Progress towards PT goals: Progressing toward goals    Frequency    7X/week      PT Plan Current plan remains appropriate    Co-evaluation              AM-PAC PT "6 Clicks" Mobility   Outcome Measure  Help needed turning from your back to your side while in a flat bed without using bedrails?: A Little Help needed moving from lying on your back to sitting on the side of a flat bed without using bedrails?: A Little Help needed moving to and from a bed to a chair (including a wheelchair)?: A Little Help needed standing up from a chair using your arms (e.g., wheelchair or bedside chair)?: None Help needed to walk in hospital room?: A Little Help needed climbing 3-5 steps with a railing? : A Little 6 Click Score: 19    End of Session Equipment Utilized During Treatment: Gait belt Activity Tolerance: Patient tolerated treatment well Patient left: with chair alarm set;in chair;with call bell/phone within reach;with family/visitor present Nurse Communication: Mobility status PT Visit Diagnosis: Other abnormalities of gait and mobility (R26.89);Muscle weakness (generalized) (M62.81)     Time: 1610-9604 PT Time Calculation (min) (ACUTE ONLY): 42 min  Charges:  $Gait Training: 8-22 mins $Therapeutic Exercise: 8-22 mins $Therapeutic Activity: 8-22 mins                     Ralene Bathe Kistler PT 08/13/2022  Acute Rehabilitation Services  Office [phone]

## 2022-08-13 NOTE — Progress Notes (Signed)
Physical Therapy Treatment Patient Details Name: Tammy [name] MRN: [id number] DOB: [birth year] Today's Date: 08/13/2022   History of Present Illness Pt is 67 yo female admitted for L TKA on 08/12/22.  Pt with hx including but not limited to OA, obesity, DM, back pain with lumbar sx, R TKA.    PT Comments    Pt is progressing well with mobility, she ambulated 82' with RW, completed stair training with her sister present (she will be primary CG), and demonstrates good understanding of HEP. She is ready to DC from a PT standpoint.    Recommendations for follow up therapy are one component of a multi-disciplinary discharge planning process, led by the attending physician.  Recommendations may be updated based on patient status, additional functional criteria and insurance authorization.  Follow Up Recommendations       Assistance Recommended at Discharge Intermittent Supervision/Assistance  Patient can return home with the following A little help with walking and/or transfers;A little help with bathing/dressing/bathroom;Assistance with cooking/housework;Help with stairs or ramp for entrance   Equipment Recommendations  None recommended by PT    Recommendations for Other Services       Precautions / Restrictions Precautions Precautions: Fall Restrictions Weight Bearing Restrictions: Yes LLE Weight Bearing: Weight bearing as tolerated     Mobility  Bed Mobility Overal bed mobility: Modified Independent Bed Mobility: Sit to Supine     Supine to sit: Modified independent (Device/Increase time), HOB elevated Sit to supine: Supervision   General bed mobility comments: used gait belt as leg lifter, self assisted LLE with RLE    Transfers Overall transfer level: Needs assistance Equipment used: Rolling walker (2 wheels) Transfers: Sit to/from Stand Sit to Stand: Supervision           General transfer comment: cues for hand placement and L LE managment     Ambulation/Gait Ambulation/Gait assistance: Supervision Gait Distance (Feet): 130 Feet Assistive device: Rolling walker (2 wheels) Gait Pattern/deviations: Step-to pattern, Decreased stride length, Decreased weight shift to left Gait velocity: decreased     General Gait Details: steady, no loss of balance, good sequencing   Stairs Stairs: Yes Stairs assistance: Min guard Stair Management: One rail Right, Forwards, With cane, Step to pattern Number of Stairs: 6 General stair comments: 3 steps x 2 trials, sister present, ongoing VCs for sequencing   Wheelchair Mobility    Modified Rankin (Stroke Patients Only)       Balance Overall balance assessment: Needs assistance Sitting-balance support: No upper extremity supported Sitting balance-Leahy Scale: Good     Standing balance support: No upper extremity supported, Bilateral upper extremity supported Standing balance-Leahy Scale: Fair Standing balance comment: RW to ambulate but able to static stand without support                            Cognition Arousal/Alertness: Awake/alert Behavior During Therapy: WFL for tasks assessed/performed Overall Cognitive Status: Within Functional Limits for tasks assessed                                          Exercises Total Joint Exercises Ankle Circles/Pumps: AROM, Both, 10 reps, Supine Quad Sets: AROM, Both, 5 reps, Supine Short Arc Quad: AROM, Left, 10 reps, Supine Heel Slides: AAROM, Left, 5 reps, Supine Hip ABduction/ADduction: AAROM, Left, 10 reps, Supine Straight Leg Raises: AAROM, Left, 5 reps,  Supine  Goniometric ROM: 0-60* AAROM L knee    General Comments        Pertinent Vitals/Pain Pain Assessment Pain Score: 10-Worst pain ever Pain Location: L knee with walking Pain Descriptors / Indicators: Discomfort, Sore Pain Intervention(s): Limited activity within patient's tolerance, Monitored during session, Patient requesting pain  meds-RN notified, RN gave pain meds during session, Ice applied    Home Living                          Prior Function            PT Goals (current goals can now be found in the care plan section) Acute Rehab PT Goals Patient Stated Goal: Reports going for rehab at [facility] SNF then home; wants to sing in choir at church PT Goal Formulation: With patient/family Time For Goal Achievement: 08/26/22 Potential to Achieve Goals: Good Progress towards PT goals: Progressing toward goals    Frequency    7X/week      PT Plan Current plan remains appropriate    Co-evaluation              AM-PAC PT "6 Clicks" Mobility   Outcome Measure  Help needed turning from your back to your side while in a flat bed without using bedrails?: A Little Help needed moving from lying on your back to sitting on the side of a flat bed without using bedrails?: A Little Help needed moving to and from a bed to a chair (including a wheelchair)?: A Little Help needed standing up from a chair using your arms (e.g., wheelchair or bedside chair)?: None Help needed to walk in hospital room?: A Little Help needed climbing 3-5 steps with a railing? : A Little 6 Click Score: 19    End of Session Equipment Utilized During Treatment: Gait belt Activity Tolerance: Patient tolerated treatment well Patient left: with call bell/phone within reach;with family/visitor present;in bed Nurse Communication: Mobility status PT Visit Diagnosis: Other abnormalities of gait and mobility (R26.89);Muscle weakness (generalized) (M62.81)     Time: 6962-9528 PT Time Calculation (min) (ACUTE ONLY): 43 min  Charges:  $Gait Training: 8-22 mins $Therapeutic Exercise: 8-22 mins $Therapeutic Activity: 8-22 mins                     Ralene Bathe Kistler PT 08/13/2022  Acute Rehabilitation Services  Office [phone]

## 2022-08-13 NOTE — Progress Notes (Signed)
Subjective: 1 Day Post-Op Procedure(s) (LRB): TOTAL KNEE ARTHROPLASTY (Left) Patient reports pain as mild.   Patient doing well, no events overnight Was able to get up yesterday with no issues   Objective: Vital signs in last 24 hours: Temp:  [96 F (35.6 C)-98.4 F (36.9 C)] 98 F (36.7 C) (04/25 0248) Pulse Rate:  [58-85] 78 (04/25 0300) Resp:  [12-20] 18 (04/25 0300) BP: (130-184)/(69-90) 149/75 (04/25 0300) SpO2:  [97 %-100 %] 99 % (04/25 0248) Weight:  [104.3 kg] 104.3 kg (04/24 1238)  Intake/Output from previous day: 04/24 0701 - 04/25 0700 In: 2595.9 [P.O.:600; I.V.:1587.8; IV Piggyback:408.1] Out: 1780 [Urine:1720; Blood:60] Intake/Output this shift: Total I/O In: 791.4 [P.O.:360; I.V.:323.3; IV Piggyback:108.1] Out: 500 [Urine:500]  Recent Labs    08/13/22 0331  HGB 12.5   Recent Labs    08/13/22 0331  WBC 10.9*  RBC 3.97  HCT 37.8  PLT 156   Recent Labs    08/13/22 0331  NA 135  K 4.4  CL 100  CO2 26  BUN 13  CREATININE 0.61  GLUCOSE 151*  CALCIUM 8.4*   No results for input(s): "LABPT", "INR" in the last 72 hours.  Neurologically intact Neurovascular intact Sensation intact distally Intact pulses distally Dorsiflexion/Plantar flexion intact Incision: dressing C/D/I Compartment soft   Assessment/Plan: 1 Day Post-Op Procedure(s) (LRB): TOTAL KNEE ARTHROPLASTY (Left) Up with therapy Discharge to SNF she has already spoken to [facility] Medications have been placed in chart DVT ppx: 81 mg ASA BID     Patient's anticipated LOS is less than 2 midnights, meeting these requirements: - Younger than 78 - Lives within 1 hour of care - Has a competent adult at home to recover with post-op recover - NO history of  - Chronic pain requiring opiods  - Diabetes  - Coronary Artery Disease  - Heart failure  - Heart attack  - Stroke  - DVT/VTE  - Cardiac arrhythmia  - Respiratory Failure/COPD  - Renal failure  - Anemia  - Advanced  Liver disease     Cherie Dark, PA-C [facility] with Dr. Thomasena Edis [phone] 08/13/2022, 6:33 AM

## 2022-08-13 NOTE — [place]2 (Signed)
[place] MEDICAID [place]2 LEVEL OF CARE FORM     IDENTIFICATION  Patient Name: Tammy [name] Birthdate: 1955/07/03 Sex: female Admission Date (Current Location): 08/12/2022  [facility] and [place] Number:  Producer, television/film/video and Address:  [facility],  [address] [place], [place] [address]      Provider Number: [id number]  Attending Physician Name and Address:  Eugenia Mcalpine, MD  Relative Name and Phone Number:  spouse, Ada Holness [phone]    Current Level of Care: Hospital Recommended Level of Care: Skilled Nursing Facility Prior Approval Number:    Date Approved/Denied:   PASRR Number: [id number]  Discharge Plan: SNF    Current Diagnoses: Patient Active Problem List   Diagnosis Date Noted   BMI 39.0-39.9,adult Current BMI 39.6 08/10/2022   BMI 40.0-44.9, adult (HCC) Current BMI 40.7 06/30/2022   Numbness 05/07/2022   Osteoarthritis of left knee 02/25/2022   Vitamin D deficiency 02/25/2022   Depression 02/25/2022   Morbid obesity 02/24/2022   Depression screening 02/24/2022   SOB (shortness of breath) 02/24/2022   Other fatigue 02/24/2022   Class 3 severe obesity with serious comorbidity and body mass index (BMI) of 45.0 to 49.9 in adult 02/10/2022   Prediabetes 02/03/2022   Primary osteoarthritis of left knee 02/03/2022   Caregiver stress 02/03/2022   Gastroesophageal reflux disease 02/03/2022   Osteoarthritis of right knee 10/22/2017   S/P knee replacement 10/22/2017   Black stool 01/16/2014   Rectal bleeding 01/16/2014   Esophageal reflux 09/16/2010   Special screening for malignant neoplasms, colon 09/16/2010   DIABETES MELLITUS 09/24/2007   ALLERGIC RHINITIS 09/24/2007   DYSFUNCTIONAL UTERINE BLEEDING 09/24/2007   PLANTAR FASCIITIS 09/24/2007   OVARIAN CYST, RIGHT 07/09/2006   SCHATZKI'S RING, HX OF 08/27/1998   HIATAL HERNIA 07/19/1998    Orientation RESPIRATION BLADDER Height & Weight     Self, Time, Situation, Place   Normal Continent Weight: 229 lb 15 oz (104.3 kg) Height:   (162.6 cm)  BEHAVIORAL SYMPTOMS/MOOD NEUROLOGICAL BOWEL NUTRITION STATUS      Continent Diet (Regular)  AMBULATORY STATUS COMMUNICATION OF NEEDS Skin   Limited Assist Verbally Other (Comment) (surgical incision only)                       Personal Care Assistance Level of Assistance  Bathing, Dressing Bathing Assistance: Limited assistance   Dressing Assistance: Limited assistance     Functional Limitations Info  Sight, Hearing, Speech Sight Info: Adequate Hearing Info: Adequate Speech Info: Adequate    SPECIAL CARE FACTORS FREQUENCY  PT (By licensed PT), OT (By licensed OT)     PT Frequency: 5x/wk OT Frequency: 5x/wk            Contractures Contractures Info: Not present    Additional Factors Info  Code Status, Allergies Code Status Info: Full Allergies Info: Benadryl (Diphenhydramine), Budesonide, Prednisone, Propoxyphene, Amoxicillin, Carisprodol (Carisoprodol), Doxycycline, Penicillins           Current Medications (08/13/2022):  This is the current hospital active medication list Current Facility-Administered Medications  Medication Dose Route Frequency Provider Last Rate Last Admin   0.9 %  sodium chloride infusion   Intravenous Continuous Zadie Cleverly R, PA   Stopped at 08/13/22 0653   acetaminophen (TYLENOL) tablet 1,000 mg  1,000 mg Oral Q4H PRN Zadie Cleverly R, PA   1,000 mg at 08/13/22 0645   acetaminophen (TYLENOL) tablet 325-650 mg  325-650 mg Oral Q6H PRN Cherie Dark, PA  aspirin chewable tablet 81 mg  81 mg Oral BID Cherie Dark, PA   81 mg at 08/13/22 1610   bisacodyl (DULCOLAX) EC tablet 5 mg  5 mg Oral Daily PRN Zadie Cleverly R, PA       celecoxib (CELEBREX) capsule 200 mg  200 mg Oral Daily Otho Bellows, RPH   200 mg at 08/13/22 0926   famotidine (PEPCID) tablet 20 mg  20 mg Oral BID Otho Bellows, RPH   20 mg at 08/13/22 9604   HYDROmorphone (DILAUDID) injection  0.5-1 mg  0.5-1 mg Intravenous Q4H PRN Zadie Cleverly R, PA   1 mg at 08/13/22 0256   loperamide HCl (IMODIUM) 2 MG/15ML solution 2 mg  2 mg Oral PRN Haus, Pearletha Furl, PA       loratadine (CLARITIN) tablet 10 mg  10 mg Oral Daily Haus, Leeanne R, PA   10 mg at 08/13/22 5409   magnesium citrate solution 1 Bottle  1 Bottle Oral Once PRN Zadie Cleverly R, PA       methocarbamol (ROBAXIN) tablet 500 mg  500 mg Oral Q6H PRN Haus, Leeanne R, PA   500 mg at 08/13/22 0256   Or   methocarbamol (ROBAXIN) 500 mg in dextrose 5 % 50 mL IVPB  500 mg Intravenous Q6H PRN Zadie Cleverly R, PA   Stopped at 08/12/22 1127   ondansetron (ZOFRAN) tablet 4 mg  4 mg Oral Q6H PRN Haus, Leeanne R, PA       Or   ondansetron (ZOFRAN) injection 4 mg  4 mg Intravenous Q6H PRN Haus, Leeanne R, PA   4 mg at 08/13/22 0915   oxyCODONE (Oxy IR/ROXICODONE) immediate release tablet 10-15 mg  10-15 mg Oral Q4H PRN Haus, Leeanne R, PA   15 mg at 08/13/22 0645   oxyCODONE (Oxy IR/ROXICODONE) immediate release tablet 5-10 mg  5-10 mg Oral Q4H PRN Haus, Leeanne R, PA   10 mg at 08/12/22 1534   senna-docusate (Senokot-S) tablet 1 tablet  1 tablet Oral QHS PRN Zadie Cleverly R, PA       traMADol (ULTRAM) tablet 50 mg  50 mg Oral Q6H Haus, Leeanne R, PA   50 mg at 08/12/22 1829     Discharge Medications: Please see discharge summary for a list of discharge medications.  Relevant Imaging Results:  Relevant Lab Results:   Additional Information SS# [id number]  Amada Jupiter, LCSW

## 2022-08-13 NOTE — TOC Initial Note (Signed)
Transition of Care [facility]) - Initial/Assessment Note    Patient Details  Name: Tammy [name] MRN: [id number] Date of Birth: 03/25/1956  [facility] CM/SW Contact:    Amada Jupiter, LCSW Phone Number: 08/13/2022, 9:47 AM  Clinical Narrative:                  Met with pt and sister today to discuss anticipated dc plans.  Per pt, she has already spoken with [facility] about dc there for rehab prior to her return home with spouse.  Reviewed with them that we will need to submit for insurance authorization today to confirm this plan will be covered.  Pt understands this and have reviewed with PA following as well.  Insurance auth begun and will keep all updated.  Expected Discharge Plan: Skilled Nursing Facility (vs. home with [facility]) Barriers to Discharge: Insurance Authorization   Patient Goals and CMS Choice Patient states their goals for this hospitalization and ongoing recovery are:: pt prefers dc to SNF rehab prior to return home          Expected Discharge Plan and Services In-house Referral: Clinical Social Work   Post Acute Care Choice: Skilled Nursing Facility Living arrangements for the past 2 months: Single Family Home Expected Discharge Date: 08/13/22                                    Prior Living Arrangements/Services Living arrangements for the past 2 months: Single Family Home Lives with:: Spouse Patient language and need for interpreter reviewed:: Yes Do you feel safe going back to the place where you live?: Yes      Need for Family Participation in Patient Care: Yes (Comment) Care giver support system in place?: Yes (comment)   Criminal Activity/Legal Involvement Pertinent to Current Situation/Hospitalization: No - Comment as needed  Activities of Daily Living Home Assistive Devices/Equipment: Walker (specify type), Cane (specify quad or straight) (rolater) ADL Screening (condition at time of admission) Patient's cognitive ability  adequate to safely complete daily activities?: Yes Is the patient deaf or have difficulty hearing?: No Does the patient have difficulty seeing, even when wearing glasses/contacts?: No Does the patient have difficulty concentrating, remembering, or making decisions?: No Patient able to express need for assistance with ADLs?: Yes Does the patient have difficulty dressing or bathing?: No Independently performs ADLs?: Yes (appropriate for developmental age) Does the patient have difficulty walking or climbing stairs?: Yes Weakness of Legs: None Weakness of Arms/Hands: None  Permission Sought/Granted Permission sought to share information with : Family Supports Permission granted to share information with : Yes, Verbal Permission Granted  Share Information with NAME: spouse, Heaven Meeker @ [phone]           Emotional Assessment Appearance:: Appears stated age Attitude/Demeanor/Rapport: Gracious Affect (typically observed): Accepting Orientation: : Oriented to Self, Oriented to Place, Oriented to  Time, Oriented to Situation Alcohol / Substance Use: Not Applicable Psych Involvement: No (comment)  Admission diagnosis:  Osteoarthritis of left knee [M17.12] Patient Active Problem List   Diagnosis Date Noted   BMI 39.0-39.9,adult Current BMI 39.6 08/10/2022   BMI 40.0-44.9, adult (HCC) Current BMI 40.7 06/30/2022   Numbness 05/07/2022   Osteoarthritis of left knee 02/25/2022   Vitamin D deficiency 02/25/2022   Depression 02/25/2022   Morbid obesity 02/24/2022   Depression screening 02/24/2022   SOB (shortness of breath) 02/24/2022   Other fatigue 02/24/2022  Class 3 severe obesity with serious comorbidity and body mass index (BMI) of 45.0 to 49.9 in adult 02/10/2022   Prediabetes 02/03/2022   Primary osteoarthritis of left knee 02/03/2022   Caregiver stress 02/03/2022   Gastroesophageal reflux disease 02/03/2022   Osteoarthritis of right knee 10/22/2017   S/P knee  replacement 10/22/2017   Black stool 01/16/2014   Rectal bleeding 01/16/2014   Esophageal reflux 09/16/2010   Special screening for malignant neoplasms, colon 09/16/2010   DIABETES MELLITUS 09/24/2007   ALLERGIC RHINITIS 09/24/2007   DYSFUNCTIONAL UTERINE BLEEDING 09/24/2007   PLANTAR FASCIITIS 09/24/2007   OVARIAN CYST, RIGHT 07/09/2006   SCHATZKI'S RING, HX OF 08/27/1998   HIATAL HERNIA 07/19/1998   PCP:  Darrow Bussing, MD Pharmacy:   [facility] [phone] [place] Otto, [place] - [address] AT [facility] [address] [place] [address] Phone: [phone] Fax: [phone]  [facility] [id number] - [place], [place] - [address] [facility] [address] [address] [facility] [address] [place] [address] Phone: [phone] Fax: [phone]     Social Determinants of Health (SDOH) Social History: SDOH Screenings   Food Insecurity: No Food Insecurity (08/12/2022)  Housing: Low Risk  (08/12/2022)  Transportation Needs: No Transportation Needs (08/12/2022)  Utilities: Not At Risk (08/12/2022)  Tobacco Use: Low Risk  (08/12/2022)   SDOH Interventions:     Readmission Risk Interventions     No data to display

## 2022-08-13 NOTE — Discharge Summary (Signed)
Orthopedic Discharge Summary      Patient ID: Tammy [name] MRN: [id number] DOB/AGE: 10-26-1955 67 y.o.  Admit date: 08/12/2022 Discharge date: 08/13/2022  Admission Diagnoses: Principal Problem:   Osteoarthritis of left knee   Discharge Diagnoses:  Principal Problem:   Osteoarthritis of left knee   Discharged Condition: good  Hospital Course: Patient was admitted on 08/12/2022 for a left total knee arthroplasty due to end stage left knee osteoarthritis. She tolerated surgery well and was sent to PACU and postop floor in stable condition. She was able to get up with PT on same day and ambulate. No events overnight. Postop day 1 patient do well. She was able to get up with PT with no issues.  Plan is for her to be discharged to SNF at [facility].   DVT ppx: ASA 81 mg BID for 6 weeks   Meds have been printed and placed in chart  Consults: None  Significant Diagnostic Studies: none  Treatments: IV hydration, antibiotics: Ancef, analgesia: acetaminophen and oxycodone, tramadol, anticoagulation: ASA, therapies: PT, and surgery: left TKA  Discharge Exam: Blood pressure (!) 172/87, pulse 79, temperature 98.2 F (36.8 C), temperature source Oral, resp. rate 15, height 5\' 4"  (1.626 m), weight 104.3 kg, SpO2 100 %. General appearance: alert, cooperative, appears stated age, and no distress Extremities: extremities normal, atraumatic, no cyanosis or edema and Homans sign is negative, no sign of DVT Pulses: 2+ and symmetric Skin: Skin color, texture, turgor normal. No rashes or lesions Neurologic: Grossly normal Incision/Wound: Dressings C/D/I  Disposition: Discharge disposition: 01-Home or Self Care       Discharge Instructions     Call MD / Call 911   Complete by: As directed    If you experience chest pain or shortness of breath, CALL 911 and be transported to the hospital emergency room.  If you develope a fever above 101 F, pus (white drainage) or increased  drainage or redness at the wound, or calf pain, call your surgeon's office.   Constipation Prevention   Complete by: As directed    Drink plenty of fluids.  Prune juice may be helpful.  You may use a stool softener, such as Colace (over the counter) 100 mg twice a day.  Use MiraLax (over the counter) for constipation as needed.   Diet - low sodium heart healthy   Complete by: As directed    Discharge instructions   Complete by: As directed    Dr. Eugenia Mcalpine [facility] [address] [place], [place] [address] [phone]  TOTAL KNEE REPLACEMENT POSTOPERATIVE DIRECTIONS  Knee Rehabilitation, Guidelines Following Surgery  Results after knee surgery are often greatly improved when you follow the exercise, range of motion and muscle strengthening exercises prescribed by your doctor. Safety measures are also important to protect the knee from further injury. Any time any of these exercises cause you to have increased pain or swelling in your knee joint, decrease the amount until you are comfortable again and slowly increase them. If you have problems or questions, call your caregiver or physical therapist for advice.   HOME CARE INSTRUCTIONS  Remove items at home which could result in a fall. This includes throw rugs or furniture in walking pathways.  ICE to the affected knee every three hours for 30 minutes at a time and then as needed for pain and swelling.  Continue to use ice on the knee for pain and swelling from surgery. You may notice swelling that will progress down  to the foot and ankle.  This is normal after surgery.  Elevate the leg when you are not up walking on it.   Continue to use the breathing machine which will help keep your temperature down.  It is common for your temperature to cycle up and down following surgery, especially at night when you are not up moving around and exerting yourself.  The breathing machine keeps your lungs expanded and your temperature  down. Do not place pillow under knee, focus on keeping the knee straight while resting   DIET You may resume your previous home diet once your are discharged from the hospital.  DRESSING / WOUND CARE / SHOWERING Keep the surgical dressing until follow up.  The dressing is water proof, but you need to put extra covering over it like plastic wrap.  IF THE DRESSING FALLS OFF or the wound gets wet inside, change the dressing with sterile gauze.  Please use good hand washing techniques before changing the dressing.  Do not use any lotions or creams on the incision until instructed by your surgeon.   You may start showering once you are discharged home but do not submerge the incision under water.  You are sent home with an ACE bandage on over the leg, this can be removed at 3 days from surgery. At this time you can start showering. Please place the white TED stocking on the surgical leg after. This needs to be worn on the surgical leg for 2 weeks after surgery.   ACTIVITY Walk with your walker as instructed. Use walker as long as suggested by your caregivers. Avoid periods of inactivity such as sitting longer than an hour when not asleep. This helps prevent blood clots.  You may resume a sexual relationship in one month or when given the OK by your doctor.  You may return to work once you are cleared by your doctor.  Do not drive a car for 6 weeks or until released by you surgeon.  Do not drive while taking narcotics.  WEIGHT BEARING Weight bearing as tolerated with assist device (walker, cane, etc) as directed, use it as long as suggested by your surgeon or therapist, typically at least 4-6 weeks.  POSTOPERATIVE CONSTIPATION PROTOCOL Constipation - defined medically as fewer than three stools per week and severe constipation as less than one stool per week.  One of the most common issues patients have following surgery is constipation.  Even if you have a regular bowel pattern at home, your  normal regimen is likely to be disrupted due to multiple reasons following surgery.  Combination of anesthesia, postoperative narcotics, change in appetite and fluid intake all can affect your bowels.  In order to avoid complications following surgery, here are some recommendations in order to help you during your recovery period.  Colace (docusate) - Pick up an over-the-counter form of Colace or another stool softener and take twice a day as long as you are requiring postoperative pain medications.  Take with a full glass of water daily.  If you experience loose stools or diarrhea, hold the colace until you stool forms back up.  If your symptoms do not get better within 1 week or if they get worse, check with your doctor.  Dulcolax (bisacodyl) - Pick up over-the-counter and take as directed by the product packaging as needed to assist with the movement of your bowels.  Take with a full glass of water.  Use this product as needed if not relieved by  Colace only.   MiraLax (polyethylene glycol) - Pick up over-the-counter to have on hand.  MiraLax is a solution that will increase the amount of water in your bowels to assist with bowel movements.  Take as directed and can mix with a glass of water, juice, soda, coffee, or tea.  Take if you go more than two days without a movement. Do not use MiraLax more than once per day. Call your doctor if you are still constipated or irregular after using this medication for 7 days in a row.  If you continue to have problems with postoperative constipation, please contact the office for further assistance and recommendations.  If you experience "the worst abdominal pain ever" or develop nausea or vomiting, please contact the office immediatly for further recommendations for treatment.  ITCHING  If you experience itching with your medications, try taking only a single pain pill, or even half a pain pill at a time.  You can also use Benadryl over the counter for itching or  also to help with sleep.   TED HOSE STOCKINGS Wear the elastic stockings on both legs for two weeks following surgery during the day but you may remove then at night for sleeping.  Okay to remove ACE in 3 days, put TED on after  MEDICATIONS See your medication summary on the "After Visit Summary" that the nursing staff will review with you prior to discharge.  You may have some home medications which will be placed on hold until you complete the course of blood thinner medication.  It is important for you to complete the blood thinner medication as prescribed by your surgeon.  Continue your approved medications as instructed at time of discharge. If you were not previously taking any blood thinners prior to surgery please start taking Aspirin 325 mg tabs twice daily for 6 weeks. If you are unable to take Aspirin please let your doctor know.   PRECAUTIONS If you experience chest pain or shortness of breath - call 911 immediately for transfer to the hospital emergency department.  If you develop a fever greater that 101 F, purulent drainage from wound, increased redness or drainage from wound, foul odor from the wound/dressing, or calf pain - CONTACT YOUR SURGEON.                                                   FOLLOW-UP APPOINTMENTS Make sure you keep all of your appointments after your operation with your surgeon and caregivers. You should call the office at the above phone number and make an appointment for approximately two weeks after the date of your surgery or on the date instructed by your surgeon outlined in the "After Visit Summary".   RANGE OF MOTION AND STRENGTHENING EXERCISES  Rehabilitation of the knee is important following a knee injury or an operation. After just a few days of immobilization, the muscles of the thigh which control the knee become weakened and shrink (atrophy). Knee exercises are designed to build up the tone and strength of the thigh muscles and to improve knee  motion. Often times heat used for twenty to thirty minutes before working out will loosen up your tissues and help with improving the range of motion but do not use heat for the first two weeks following surgery. These exercises can be done on a training (exercise)  mat, on the floor, on a table or on a bed. Use what ever works the best and is most comfortable for you Knee exercises include:  Leg Lifts - While your knee is still immobilized in a splint or cast, you can do straight leg raises. Lift the leg to 60 degrees, hold for 3 sec, and slowly lower the leg. Repeat 10-20 times 2-3 times daily. Perform this exercise against resistance later as your knee gets better.  Quad and Hamstring Sets - Tighten up the muscle on the front of the thigh (Quad) and hold for 5-10 sec. Repeat this 10-20 times hourly. Hamstring sets are done by pushing the foot backward against an object and holding for 5-10 sec. Repeat as with quad sets.  Leg Slides: Lying on your back, slowly slide your foot toward your buttocks, bending your knee up off the floor (only go as far as is comfortable). Then slowly slide your foot back down until your leg is flat on the floor again. Angel Wings: Lying on your back spread your legs to the side as far apart as you can without causing discomfort.  A rehabilitation program following serious knee injuries can speed recovery and prevent re-injury in the future due to weakened muscles. Contact your doctor or a physical therapist for more information on knee rehabilitation.   IF YOU ARE TRANSFERRED TO A SKILLED REHAB FACILITY If the patient is transferred to a skilled rehab facility following release from the hospital, a list of the current medications will be sent to the facility for the patient to continue.  When discharged from the skilled rehab facility, please have the facility set up the patient's Home Health Physical Therapy prior to being released. Also, the skilled facility will be  responsible for providing the patient with their medications at time of release from the facility to include their pain medication, the muscle relaxants, and their blood thinner medication. If the patient is still at the rehab facility at time of the two week follow up appointment, the skilled rehab facility will also need to assist the patient in arranging follow up appointment in our office and any transportation needs.  MAKE SURE YOU:  Understand these instructions.  Get help right away if you are not doing well or get worse.    Pick up stool softner and laxative for home use following surgery while on pain medications. May shower starting three days after surgery. Please use a clean towel to pat the leg dry following showers. Continue to use ice for pain and swelling after surgery. Do not use any lotions or creams on the incision until instructed by you Start Aspirin immediately following surgery.   Do not put a pillow under the knee. Place it under the heel.   Complete by: As directed    Driving restrictions   Complete by: As directed    No driving for two weeks   Post-operative opioid taper instructions:   Complete by: As directed    POST-OPERATIVE OPIOID TAPER INSTRUCTIONS: It is important to wean off of your opioid medication as soon as possible. If you do not need pain medication after your surgery it is ok to stop day one. Opioids include: Codeine, Hydrocodone(Norco, Vicodin), Oxycodone(Percocet, oxycontin) and hydromorphone amongst others.  Long term and even short term use of opiods can cause: Increased pain response Dependence Constipation Depression Respiratory depression And more.  Withdrawal symptoms can include Flu like symptoms Nausea, vomiting And more Techniques to manage these symptoms Hydrate well  Eat regular healthy meals Stay active Use relaxation techniques(deep breathing, meditating, yoga) Do Not substitute Alcohol to help with tapering If you have  been on opioids for less than two weeks and do not have pain than it is ok to stop all together.  Plan to wean off of opioids This plan should start within one week post op of your joint replacement. Maintain the same interval or time between taking each dose and first decrease the dose.  Cut the total daily intake of opioids by one tablet each day Next start to increase the time between doses. The last dose that should be eliminated is the evening dose.      TED hose   Complete by: As directed    Use stockings (TED hose) for three weeks on both leg(s).  You may remove them at night for sleeping.   Weight bearing as tolerated   Complete by: As directed       Allergies as of 08/13/2022       Reactions   Benadryl [diphenhydramine] Other (See Comments)   Reports it makes her fall asleep.   Budesonide    Weight gain, felt like stomach ulcers    Prednisone    Weight gain, aggressive behavior    Propoxyphene    Hallucinations    Amoxicillin Rash   Carisprodol [carisoprodol] Itching, Rash   Doxycycline Itching, Rash   Penicillins Rash   Has patient had a PCN reaction causing immediate rash, facial/tongue/throat swelling, SOB or lightheadedness with hypotension: yes Has patient had a PCN reaction causing severe rash involving mucus membranes or skin necrosis: no Has patient had a PCN reaction that required hospitalization: no Has patient had a PCN reaction occurring within the last 10 years: yes If all of the above answers are "NO", then may proceed with Cephalosporin use.        Medication List     TAKE these medications    acetaminophen 500 MG tablet Commonly known as: TYLENOL Take 1,000 mg by mouth every 4 (four) hours as needed for mild pain or headache.   aspirin 81 MG chewable tablet Chew 1 tablet (81 mg total) by mouth 2 (two) times daily.   celecoxib 200 MG capsule Commonly known as: CELEBREX Take 200 mg by mouth daily.   diclofenac Sodium 1 % Gel Commonly  known as: VOLTAREN Apply 1 Application topically 4 (four) times daily as needed (pain).   famotidine 20 MG tablet Commonly known as: Pepcid Take 1 tablet (20 mg total) by mouth 2 (two) times daily.   loperamide 1 MG/5ML solution Commonly known as: IMODIUM Take 6 mg by mouth as needed for diarrhea or loose stools.   loratadine 10 MG tablet Commonly known as: CLARITIN Take 10 mg by mouth every morning.   methocarbamol 500 MG tablet Commonly known as: ROBAXIN Take 1 tablet (500 mg total) by mouth every 6 (six) hours as needed for muscle spasms.   MULTIVITAMIN PO Take 1 tablet by mouth every morning. Women's Centrum Silver   ondansetron 4 MG tablet Commonly known as: ZOFRAN Take 1 tablet (4 mg total) by mouth every 6 (six) hours as needed for nausea.   oxyCODONE 5 MG immediate release tablet Commonly known as: Oxy IR/ROXICODONE Take 1-2 tablets (5-10 mg total) by mouth every 4 (four) hours as needed for moderate pain (pain score 4-6).   traMADol 50 MG tablet Commonly known as: ULTRAM Take 1 tablet (50 mg total) by mouth every 6 (six) hours.  vitamin C 1000 MG tablet Take 1,000 mg by mouth daily.   Vitamin D3 125 MCG (5000 UT) Tabs 5000 IU daily and 10,000 IU Fri, Sat and Sun               Discharge Care Instructions  (From admission, onward)           Start     Ordered   08/13/22 0000  Weight bearing as tolerated        08/13/22 0641             Signed: Cherie Dark Dr. Shawnie Pons [phone] 08/13/2022, 6:41 AM

## 2022-08-13 NOTE — TOC Transition Note (Signed)
Transition of Care [facility]) - CM/SW Discharge Note   Patient Details  Name: Tammy [name] MRN: [id number] Date of Birth: 09/13/1955  [facility] of [facility] CM/SW Contact:  Amada Jupiter, LCSW Phone Number: 08/13/2022, 3:32 PM   Clinical Narrative:    Received insurance authorization for SNF at [facility] # [id number]) and pt cleared for dc today.  Sister to provide transportation.  RN to call report to [phone].  No further TOC needs.   Final next level of care: Skilled Nursing Facility Barriers to Discharge: Barriers Resolved   Patient Goals and CMS Choice      Discharge Placement PASRR number recieved: 08/13/22 PASRR number recieved: 08/13/22            Patient chooses bed at: [facility] Patient to be transferred to facility by: sister to transport Name of family member notified: pt and sister Patient and family notified of of transfer: 08/13/22  Discharge Plan and Services Additional resources added to the After Visit Summary for   In-house Referral: Clinical Social Work   Post Acute Care Choice: Skilled Nursing Facility                               Social Determinants of Health (SDOH) Interventions SDOH Screenings   Food Insecurity: No Food Insecurity (08/12/2022)  Housing: Low Risk  (08/12/2022)  Transportation Needs: No Transportation Needs (08/12/2022)  Utilities: Not At Risk (08/12/2022)  Tobacco Use: Low Risk  (08/13/2022)     Readmission Risk Interventions     No data to display

## 2022-08-14 DIAGNOSIS — Z96652 Presence of left artificial knee joint: Secondary | ICD-10-CM | POA: Diagnosis not present

## 2022-08-14 DIAGNOSIS — M549 Dorsalgia, unspecified: Secondary | ICD-10-CM | POA: Diagnosis not present

## 2022-08-14 DIAGNOSIS — K219 Gastro-esophageal reflux disease without esophagitis: Secondary | ICD-10-CM | POA: Diagnosis not present

## 2022-08-22 DIAGNOSIS — Z556 Problems related to health literacy: Secondary | ICD-10-CM | POA: Diagnosis not present

## 2022-08-22 DIAGNOSIS — M1990 Unspecified osteoarthritis, unspecified site: Secondary | ICD-10-CM | POA: Diagnosis not present

## 2022-08-22 DIAGNOSIS — J309 Allergic rhinitis, unspecified: Secondary | ICD-10-CM | POA: Diagnosis not present

## 2022-08-22 DIAGNOSIS — Z9181 History of falling: Secondary | ICD-10-CM | POA: Diagnosis not present

## 2022-08-22 DIAGNOSIS — Z7982 Long term (current) use of aspirin: Secondary | ICD-10-CM | POA: Diagnosis not present

## 2022-08-22 DIAGNOSIS — Z791 Long term (current) use of non-steroidal anti-inflammatories (NSAID): Secondary | ICD-10-CM | POA: Diagnosis not present

## 2022-08-22 DIAGNOSIS — K219 Gastro-esophageal reflux disease without esophagitis: Secondary | ICD-10-CM | POA: Diagnosis not present

## 2022-08-22 DIAGNOSIS — Z96652 Presence of left artificial knee joint: Secondary | ICD-10-CM | POA: Diagnosis not present

## 2022-08-22 DIAGNOSIS — Z471 Aftercare following joint replacement surgery: Secondary | ICD-10-CM | POA: Diagnosis not present

## 2022-08-27 DIAGNOSIS — K219 Gastro-esophageal reflux disease without esophagitis: Secondary | ICD-10-CM | POA: Diagnosis not present

## 2022-08-27 DIAGNOSIS — Z556 Problems related to health literacy: Secondary | ICD-10-CM | POA: Diagnosis not present

## 2022-08-27 DIAGNOSIS — Z471 Aftercare following joint replacement surgery: Secondary | ICD-10-CM | POA: Diagnosis not present

## 2022-08-27 DIAGNOSIS — Z791 Long term (current) use of non-steroidal anti-inflammatories (NSAID): Secondary | ICD-10-CM | POA: Diagnosis not present

## 2022-08-27 DIAGNOSIS — M1990 Unspecified osteoarthritis, unspecified site: Secondary | ICD-10-CM | POA: Diagnosis not present

## 2022-08-27 DIAGNOSIS — Z96652 Presence of left artificial knee joint: Secondary | ICD-10-CM | POA: Diagnosis not present

## 2022-08-27 DIAGNOSIS — Z7982 Long term (current) use of aspirin: Secondary | ICD-10-CM | POA: Diagnosis not present

## 2022-08-27 DIAGNOSIS — Z9181 History of falling: Secondary | ICD-10-CM | POA: Diagnosis not present

## 2022-08-27 DIAGNOSIS — J309 Allergic rhinitis, unspecified: Secondary | ICD-10-CM | POA: Diagnosis not present

## 2022-08-28 DIAGNOSIS — Z4789 Encounter for other orthopedic aftercare: Secondary | ICD-10-CM | POA: Diagnosis not present

## 2022-08-28 DIAGNOSIS — Z4889 Encounter for other specified surgical aftercare: Secondary | ICD-10-CM | POA: Diagnosis not present

## 2022-09-08 ENCOUNTER — Ambulatory Visit (INDEPENDENT_AMBULATORY_CARE_PROVIDER_SITE_OTHER): Payer: PPO | Admitting: Physician Assistant

## 2022-09-21 DIAGNOSIS — M25562 Pain in left knee: Secondary | ICD-10-CM | POA: Diagnosis not present

## 2022-09-23 DIAGNOSIS — M25562 Pain in left knee: Secondary | ICD-10-CM | POA: Diagnosis not present

## 2022-09-29 ENCOUNTER — Ambulatory Visit (INDEPENDENT_AMBULATORY_CARE_PROVIDER_SITE_OTHER): Payer: PPO | Admitting: Physician Assistant

## 2022-09-29 ENCOUNTER — Encounter (INDEPENDENT_AMBULATORY_CARE_PROVIDER_SITE_OTHER): Payer: Self-pay | Admitting: Physician Assistant

## 2022-09-29 VITALS — BP 127/77 | HR 66 | Temp 97.8°F | Ht 64.0 in | Wt 218.0 lb

## 2022-09-29 DIAGNOSIS — E559 Vitamin D deficiency, unspecified: Secondary | ICD-10-CM | POA: Diagnosis not present

## 2022-09-29 DIAGNOSIS — Z6837 Body mass index (BMI) 37.0-37.9, adult: Secondary | ICD-10-CM

## 2022-09-29 DIAGNOSIS — R7303 Prediabetes: Secondary | ICD-10-CM | POA: Diagnosis not present

## 2022-09-29 NOTE — Progress Notes (Unsigned)
.smr  Office: [phone]  /  Fax: [phone]  WEIGHT SUMMARY AND BIOMETRICS  Vitals Temp: 97.8 F (36.6 C) BP: 127/77 Pulse Rate: 66 SpO2: 100 %   Anthropometric Measurements Height: 5\' 4"  (1.626 m) Weight: 218 lb (98.9 kg) BMI (Calculated): 37.4 Weight at Last Visit: 230 lb Weight Lost Since Last Visit: 12 lb Weight Gained Since Last Visit: 0 lb Starting Weight: 272 lb   Body Composition  Body Fat %: 53.9 % Fat Mass (lbs): 118 lbs Muscle Mass (lbs): 95.6 lbs Visceral Fat Rating : 17   Other Clinical Data Fasting: No Labs: No Today's Visit #: 12 Starting Date: 02/03/22     HPI  Chief Complaint: OBESITY  Kalayna is here to discuss her progress with her obesity treatment plan. She is on the the Category 1 Plan and states she is following her eating plan approximately 80 % of the time. She states she is exercising Physical therapy 45 minutes 2-3 times per week.   Interval History:  Since last office visit she is down 12 lbs./ Total of 54 lbs !!!/ TBW loss of 19.9% !!! She underwent Left Total knee replacement surgery 08/12/22. She is 7 weeks post op and has made excellent progress. She is walking without assistive devices and driving now!  Hunger/appetite-well controlled Cravings- not excessive, some craving for peanut butter Stress- much better since having surgery for TKA Sleep- some continued disruption due to finding comfortable position after knee replacement Exercise-PT 2-3 times weekly and doing home exercises regularly between therapy Hydration-working on increasing water intake.  Protein- consistently meeting protein goals.   Pharmacotherapy: None for weight loss  TREATMENT PLAN FOR OBESITY: Total of 54 lbs !!!/ TBW loss of 19.9% !!! Recommended Dietary Goals  Latondra is currently in the action stage of change. As such, her goal is to continue weight management plan. She has agreed to the Category 1 Plan.  Behavioral Intervention  We  discussed the following Behavioral Modification Strategies today: increasing lean protein intake, decreasing simple carbohydrates , increasing vegetables, increasing lower glycemic fruits, increasing fiber rich foods, increasing water intake, continue to practice mindfulness when eating, and planning for success.  Additional resources provided today: Smart fruit hand out  Recommended Physical Activity Goals  Ventura has been advised to work up to 150 minutes of moderate intensity aerobic activity a week and strengthening exercises 2-3 times per week for cardiovascular health, weight loss maintenance and preservation of muscle mass.   She has agreed to Continue current level of physical activity    Pharmacotherapy We discussed various medication options to help Pukalani with her weight loss efforts and we both agreed to continue to work on nutritional and behavioral strategies to promote weight loss.      Return in about 4 weeks (around 10/27/2022) for Non fasting labs in 4 weeks. .. She was informed of the importance of frequent follow up visits to maximize her success with intensive lifestyle modifications for her multiple health conditions.  PHYSICAL EXAM:  Blood pressure 127/77, pulse 66, temperature 97.8 F (36.6 C), height 5\' 4"  (1.626 m), weight 218 lb (98.9 kg), SpO2 100 %. Body mass index is 37.42 kg/m.  General: She is overweight, cooperative, alert, well developed, and in no acute distress. Walking without assistive devices. Left knee incision healing well.  PSYCH: Has normal mood, affect and thought process.   Cardiovascular: HR 60's regular, BP 127/77 Lungs: Normal breathing effort, no conversational dyspnea.  DIAGNOSTIC DATA REVIEWED:  BMET    Component  Value Date/Time   NA 135 08/13/2022 0331   NA 142 02/24/2022 1132   K 4.4 08/13/2022 0331   CL 100 08/13/2022 0331   CO2 26 08/13/2022 0331   GLUCOSE 151 (H) 08/13/2022 0331   BUN 13 08/13/2022 0331   BUN 22  02/24/2022 1132   CREATININE 0.61 08/13/2022 0331   CALCIUM 8.4 (L) 08/13/2022 0331   GFRNONAA >60 08/13/2022 0331   GFRAA >60 10/23/2017 0514   Lab Results  Component Value Date   HGBA1C 5.2 08/05/2022   HGBA1C 5.5 02/24/2022   Lab Results  Component Value Date   INSULIN 9.6 02/24/2022   Lab Results  Component Value Date   TSH 1.220 02/24/2022   CBC    Component Value Date/Time   WBC 10.9 (H) 08/13/2022 0331   RBC 3.97 08/13/2022 0331   HGB 12.5 08/13/2022 0331   HGB 14.3 02/24/2022 1132   HCT 37.8 08/13/2022 0331   HCT 42.7 02/24/2022 1132   PLT 156 08/13/2022 0331   PLT 232 02/24/2022 1132   MCV 95.2 08/13/2022 0331   MCV 95 02/24/2022 1132   MCH 31.5 08/13/2022 0331   MCHC 33.1 08/13/2022 0331   RDW 13.2 08/13/2022 0331   RDW 13.5 02/24/2022 1132   Iron Studies No results found for: "IRON", "TIBC", "FERRITIN", "IRONPCTSAT" Lipid Panel     Component Value Date/Time   CHOL 149 02/24/2022 1132   TRIG 130 02/24/2022 1132   HDL 52 02/24/2022 1132   LDLCALC 74 02/24/2022 1132   Hepatic Function Panel     Component Value Date/Time   PROT 7.4 08/05/2022 1415   PROT 6.2 02/24/2022 1132   ALBUMIN 4.2 08/05/2022 1415   ALBUMIN 4.0 02/24/2022 1132   AST 23 08/05/2022 1415   ALT 21 08/05/2022 1415   ALKPHOS 52 08/05/2022 1415   BILITOT 1.0 08/05/2022 1415   BILITOT 0.4 02/24/2022 1132      Component Value Date/Time   TSH 1.220 02/24/2022 1132   Nutritional Lab Results  Component Value Date   VD25OH 26.8 (L) 02/24/2022    ASSOCIATED CONDITIONS ADDRESSED TODAY  ASSESSMENT AND PLAN  Problem List Items Addressed This Visit     Prediabetes - Primary   Morbid obesity (HCC)   Vitamin D deficiency   BMI 37.0-37.9, adult  Prediabetes Last A1c was 5.2- at goal  Medication(s): none Polyphagia:No She is working on nutrition plan to decrease simple carbohydrates, increase lean proteins and exercise to promote weight loss, improve glycemic control and  prevent progression to Type 2 diabetes.   Lab Results  Component Value Date   HGBA1C 5.2 08/05/2022   HGBA1C 5.5 02/24/2022   Lab Results  Component Value Date   INSULIN 9.6 02/24/2022    Plan: Continue working on nutrition plan to decrease simple carbohydrates, increase lean proteins and exercise to promote weight loss, improve glycemic control and prevent progression to Type 2 diabetes.  She has made remarkable progress with weight loss!  Vitamin D Deficiency Vitamin D is not at goal of 50.  Most recent vitamin D level was 26.8. She is on OTC vitamin D3 5000 IU daily. Lab Results  Component Value Date   VD25OH 26.8 (L) 02/24/2022    Plan: Continue OTC vitamin D3 5000 IU daily Low vitamin D levels can be associated with adiposity and may result in leptin resistance and weight gain. Also associated with fatigue. Currently on vitamin D supplementation without any adverse effects.  Will plan to recheck level over the  next 1-2 months.   ATTESTASTION STATEMENTS:  Reviewed by clinician on day of visit: allergies, medications, problem list, medical history, surgical history, family history, social history, and previous encounter notes.   I have personally spent 30 minutes total time today in preparation, patient care, nutritional counseling and documentation for this visit, including the following: review of clinical lab tests; review of medical tests/procedures/services.      Jimmye Wisnieski, PA-C

## 2022-09-30 DIAGNOSIS — M25562 Pain in left knee: Secondary | ICD-10-CM | POA: Diagnosis not present

## 2022-10-07 DIAGNOSIS — M25562 Pain in left knee: Secondary | ICD-10-CM | POA: Diagnosis not present

## 2022-10-14 DIAGNOSIS — M25562 Pain in left knee: Secondary | ICD-10-CM | POA: Diagnosis not present

## 2022-10-21 DIAGNOSIS — M25562 Pain in left knee: Secondary | ICD-10-CM | POA: Diagnosis not present

## 2022-10-26 DIAGNOSIS — H00015 Hordeolum externum left lower eyelid: Secondary | ICD-10-CM | POA: Diagnosis not present

## 2022-10-28 DIAGNOSIS — M25562 Pain in left knee: Secondary | ICD-10-CM | POA: Diagnosis not present

## 2022-10-29 ENCOUNTER — Ambulatory Visit (INDEPENDENT_AMBULATORY_CARE_PROVIDER_SITE_OTHER): Payer: PPO | Admitting: Physician Assistant

## 2022-11-03 DIAGNOSIS — Z4789 Encounter for other orthopedic aftercare: Secondary | ICD-10-CM | POA: Diagnosis not present

## 2022-11-04 ENCOUNTER — Ambulatory Visit (INDEPENDENT_AMBULATORY_CARE_PROVIDER_SITE_OTHER): Payer: PPO | Admitting: Internal Medicine

## 2022-11-04 ENCOUNTER — Encounter (INDEPENDENT_AMBULATORY_CARE_PROVIDER_SITE_OTHER): Payer: Self-pay | Admitting: Internal Medicine

## 2022-11-04 VITALS — BP 110/67 | HR 64 | Temp 98.6°F | Ht 64.0 in | Wt 223.0 lb

## 2022-11-04 DIAGNOSIS — Z6838 Body mass index (BMI) 38.0-38.9, adult: Secondary | ICD-10-CM | POA: Diagnosis not present

## 2022-11-04 DIAGNOSIS — E88819 Insulin resistance, unspecified: Secondary | ICD-10-CM

## 2022-11-04 DIAGNOSIS — E559 Vitamin D deficiency, unspecified: Secondary | ICD-10-CM

## 2022-11-04 NOTE — Assessment & Plan Note (Signed)
TBWL 46 lbs, 19 % of TBW. Having some boredom or dieting fatigue. Reviewed way to increase variety at lunch, may use a meal replacement for convenience. Suspect some metabolic adaptations occurring. See obesity treatment plan. May benefit from AOM in the future. I can' t confirm diagnosis of diabetes or pre DM based on available records. She does have insulin resistance. Also unable to access records from [place].

## 2022-11-04 NOTE — Progress Notes (Signed)
Office: [phone]  /  Fax: [phone]  WEIGHT SUMMARY AND BIOMETRICS  Vitals Temp: 98.6 F (37 C) BP: 110/67 Pulse Rate: 64 SpO2: 97 %   Anthropometric Measurements Height: 5\' 4"  (1.626 m) Weight: 223 lb (101.2 kg) BMI (Calculated): 38.26 Weight at Last Visit: 218 lb Weight Lost Since Last Visit: 0 Weight Gained Since Last Visit: 5 lb Starting Weight: 264 lb Total Weight Loss (lbs): 41 lb (18.6 kg) Peak Weight: 286 lb   Body Composition  Body Fat %: 54.1 % Fat Mass (lbs): 120.6 lbs Muscle Mass (lbs): 97.2 lbs Visceral Fat Rating : 17    No data recorded Today's Visit #: 13  Starting Date: 02/24/22   HPI  Chief Complaint: OBESITY  Tammy [name] is here to discuss her progress with her obesity treatment plan. She is on the the Category 1 Plan and states she is following her eating plan approximately 75 % of the time. She states she is exercising silver sneakers, 45 minutes stationary bike for 30 minutes  3 times per week.  Interval History:  I have not seen patient since February.  Since then she has undergone left knee replacement and is doing well.  She is now able to drive and has been going out more often.  She acknowledges increasing consumption of almost on a daily basis.  She also notes having cravings at night while watching TV.  She is requesting lunch ideas as she is having some diet fatigue and boredom.  For the most part she reports following plan.  Orixegenic Control: Denies problems with appetite and hunger signals.  Denies problems with satiety and satiation.  Reports problems with eating patterns and portion control.  Reports abnormal cravings. Denies feeling deprived or restricted.   Barriers identified: having difficulty preparing healthy meals, having difficulty with meal prep and planning, and exposure to enticing environments and/or relationships.   Pharmacotherapy for weight loss: She is currently taking no anti-obesity medication.     ASSESSMENT AND PLAN  TREATMENT PLAN FOR OBESITY:  Recommended Dietary Goals  [name] is currently in the action stage of change. As such, her goal is to continue weight management plan. She has agreed to: continue current plan.  We reviewed other lunch options including salads and power bowls which she can meal prep.  She may also go to [email or web address] for recipe ideas.  We also discussed about using a meal replacement from time to time this will further reduce her caloric intake while meeting protein requirements.  Behavioral Intervention  We discussed the following Behavioral Modification Strategies today: increasing lean protein intake, decreasing simple carbohydrates , increasing vegetables, increasing lower glycemic fruits, increasing fiber rich foods, increasing water intake, emotional eating strategies and understanding the difference between hunger signals and cravings, and planning for success.  Additional resources provided today: None  Recommended Physical Activity Goals  [name] has been advised to work up to 150 minutes of moderate intensity aerobic activity a week and strengthening exercises 2-3 times per week for cardiovascular health, weight loss maintenance and preservation of muscle mass.   She has agreed to :  Think about ways to increase daily physical activity and overcoming barriers to exercise  Pharmacotherapy We discussed various medication options to help [facility] with her weight loss efforts and we both agreed to : continue with nutritional and behavioral strategies  ASSOCIATED CONDITIONS ADDRESSED TODAY  Vitamin D deficiency Assessment & Plan: Most recent vitamin D levels  Lab Results  Component Value Date   VD25OH  26.8 (L) 02/24/2022   . Deficiency state associated with adiposity and may result in leptin resistance and weight gain. Also associated with fatigue.  Currently on vitamin D supplementation without any adverse effects.  Continue  over-the-counter vitamin D, currently taking 5000 units daily for 3 months  and check levels at the next visit.   Orders: -     VITAMIN D 25 Hydroxy (Vit-D Deficiency, Fractures)  Morbid obesity (HCC) Assessment & Plan: TBWL 46 lbs, 19 % of TBW. Having some boredom or dieting fatigue. Reviewed way to increase variety at lunch, may use a meal replacement for convenience. Suspect some metabolic adaptations occurring. See obesity treatment plan. May benefit from AOM in the future. I can' t confirm diagnosis of diabetes or pre DM based on available records. She does have insulin resistance. Also unable to access records from [place].  Orders: -     CMP14+EGFR -     Insulin, random -     Lipid Panel With LDL/HDL Ratio  Insulin resistance Assessment & Plan: Her HOMA-IR is 2.15 which is mildly elevated. Optimal level < 1.9. This is complex condition associated with genetics, ectopic fat and lifestyle factors. Insulin resistance may result in weight gain, abnormal cravings (particularly for carbs) and fatigue. This may result in additional weight gain and lead to pre-diabetes and diabetes if untreated.   Lab Results  Component Value Date   HGBA1C 5.2 08/05/2022   Lab Results  Component Value Date   INSULIN 9.6 02/24/2022   Lab Results  Component Value Date   GLUCOSE 151 (H) 08/13/2022   GLUCOSE 143 (H) 10/23/2017   Continue with nutritional and behavioral strategies. She has been consuming some refined sugars and we reviewed ways to ameliorate this.       PHYSICAL EXAM:  Blood pressure 110/67, pulse 64, temperature 98.6 F (37 C), height 5\' 4"  (1.626 m), weight 223 lb (101.2 kg), SpO2 97%. Body mass index is 38.28 kg/m.  General: She is overweight, cooperative, alert, well developed, and in no acute distress. PSYCH: Has normal mood, affect and thought process.   HEENT: EOMI, sclerae are anicteric. Lungs: Normal breathing effort, no conversational dyspnea. Extremities: No edema.   Neurologic: No gross sensory or motor deficits. No tremors or fasciculations noted.    DIAGNOSTIC DATA REVIEWED:  BMET    Component Value Date/Time   NA 135 08/13/2022 0331   NA 142 02/24/2022 1132   K 4.4 08/13/2022 0331   CL 100 08/13/2022 0331   CO2 26 08/13/2022 0331   GLUCOSE 151 (H) 08/13/2022 0331   BUN 13 08/13/2022 0331   BUN 22 02/24/2022 1132   CREATININE 0.61 08/13/2022 0331   CALCIUM 8.4 (L) 08/13/2022 0331   GFRNONAA >60 08/13/2022 0331   GFRAA >60 10/23/2017 0514   Lab Results  Component Value Date   HGBA1C 5.2 08/05/2022   HGBA1C 5.5 02/24/2022   Lab Results  Component Value Date   INSULIN 9.6 02/24/2022   Lab Results  Component Value Date   TSH 1.220 02/24/2022   CBC    Component Value Date/Time   WBC 10.9 (H) 08/13/2022 0331   RBC 3.97 08/13/2022 0331   HGB 12.5 08/13/2022 0331   HGB 14.3 02/24/2022 1132   HCT 37.8 08/13/2022 0331   HCT 42.7 02/24/2022 1132   PLT 156 08/13/2022 0331   PLT 232 02/24/2022 1132   MCV 95.2 08/13/2022 0331   MCV 95 02/24/2022 1132   MCH 31.5 08/13/2022 0331   MCHC  33.1 08/13/2022 0331   RDW 13.2 08/13/2022 0331   RDW 13.5 02/24/2022 1132   Iron Studies No results found for: "IRON", "TIBC", "FERRITIN", "IRONPCTSAT" Lipid Panel     Component Value Date/Time   CHOL 149 02/24/2022 1132   TRIG 130 02/24/2022 1132   HDL 52 02/24/2022 1132   LDLCALC 74 02/24/2022 1132   Hepatic Function Panel     Component Value Date/Time   PROT 7.4 08/05/2022 1415   PROT 6.2 02/24/2022 1132   ALBUMIN 4.2 08/05/2022 1415   ALBUMIN 4.0 02/24/2022 1132   AST 23 08/05/2022 1415   ALT 21 08/05/2022 1415   ALKPHOS 52 08/05/2022 1415   BILITOT 1.0 08/05/2022 1415   BILITOT 0.4 02/24/2022 1132      Component Value Date/Time   TSH 1.220 02/24/2022 1132   Nutritional Lab Results  Component Value Date   VD25OH 26.8 (L) 02/24/2022     No follow-ups on file.[name] She was informed of the importance of frequent follow up  visits to maximize her success with intensive lifestyle modifications for her multiple health conditions.   ATTESTASTION STATEMENTS:  Reviewed by clinician on day of visit: allergies, medications, problem list, medical history, surgical history, family history, social history, and previous encounter notes.     Worthy Rancher, MD

## 2022-11-04 NOTE — Assessment & Plan Note (Signed)
Most recent vitamin D levels  Lab Results  Component Value Date   VD25OH 26.8 (L) 02/24/2022   . Deficiency state associated with adiposity and may result in leptin resistance and weight gain. Also associated with fatigue.  Currently on vitamin D supplementation without any adverse effects.  Continue over-the-counter vitamin D, currently taking 5000 units daily for 3 months  and check levels at the next visit.

## 2022-11-04 NOTE — Assessment & Plan Note (Signed)
Her HOMA-IR is 2.15 which is mildly elevated. Optimal level < 1.9. This is complex condition associated with genetics, ectopic fat and lifestyle factors. Insulin resistance may result in weight gain, abnormal cravings (particularly for carbs) and fatigue. This may result in additional weight gain and lead to pre-diabetes and diabetes if untreated.   Lab Results  Component Value Date   HGBA1C 5.2 08/05/2022   Lab Results  Component Value Date   INSULIN 9.6 02/24/2022   Lab Results  Component Value Date   GLUCOSE 151 (H) 08/13/2022   GLUCOSE 143 (H) 10/23/2017   Continue with nutritional and behavioral strategies. She has been consuming some refined sugars and we reviewed ways to ameliorate this.

## 2022-11-20 ENCOUNTER — Other Ambulatory Visit: Payer: Self-pay | Admitting: Family Medicine

## 2022-11-20 DIAGNOSIS — Z1231 Encounter for screening mammogram for malignant neoplasm of breast: Secondary | ICD-10-CM

## 2022-12-01 DIAGNOSIS — Z5181 Encounter for therapeutic drug level monitoring: Secondary | ICD-10-CM | POA: Diagnosis not present

## 2022-12-01 DIAGNOSIS — Z6841 Body Mass Index (BMI) 40.0 and over, adult: Secondary | ICD-10-CM | POA: Diagnosis not present

## 2022-12-01 DIAGNOSIS — Z0001 Encounter for general adult medical examination with abnormal findings: Secondary | ICD-10-CM | POA: Diagnosis not present

## 2022-12-01 DIAGNOSIS — R7301 Impaired fasting glucose: Secondary | ICD-10-CM | POA: Diagnosis not present

## 2022-12-01 DIAGNOSIS — Z79899 Other long term (current) drug therapy: Secondary | ICD-10-CM | POA: Diagnosis not present

## 2022-12-02 ENCOUNTER — Ambulatory Visit (INDEPENDENT_AMBULATORY_CARE_PROVIDER_SITE_OTHER): Payer: PPO | Admitting: Physician Assistant

## 2022-12-25 ENCOUNTER — Ambulatory Visit
Admission: RE | Admit: 2022-12-25 | Discharge: 2022-12-25 | Disposition: A | Discharge status: Home / Self Care | Payer: PPO | Source: Ambulatory Visit | Attending: Family Medicine | Admitting: Family Medicine

## 2022-12-25 DIAGNOSIS — Z1231 Encounter for screening mammogram for malignant neoplasm of breast: Secondary | ICD-10-CM | POA: Diagnosis not present

## 2023-01-05 ENCOUNTER — Encounter (INDEPENDENT_AMBULATORY_CARE_PROVIDER_SITE_OTHER): Payer: Self-pay | Admitting: Physician Assistant

## 2023-01-05 ENCOUNTER — Ambulatory Visit (INDEPENDENT_AMBULATORY_CARE_PROVIDER_SITE_OTHER): Payer: PPO | Admitting: Physician Assistant

## 2023-01-05 VITALS — BP 123/74 | HR 65 | Temp 97.9°F | Ht 64.0 in | Wt 241.0 lb

## 2023-01-05 DIAGNOSIS — E88819 Insulin resistance, unspecified: Secondary | ICD-10-CM

## 2023-01-05 DIAGNOSIS — Z6841 Body Mass Index (BMI) 40.0 and over, adult: Secondary | ICD-10-CM

## 2023-01-05 DIAGNOSIS — E559 Vitamin D deficiency, unspecified: Secondary | ICD-10-CM | POA: Diagnosis not present

## 2023-01-05 DIAGNOSIS — Z96652 Presence of left artificial knee joint: Secondary | ICD-10-CM | POA: Diagnosis not present

## 2023-01-05 NOTE — Progress Notes (Signed)
.smr  Office: [phone]  /  Fax: [phone]  WEIGHT SUMMARY AND BIOMETRICS  Vitals Temp: 97.9 F (36.6 C) BP: 123/74 Pulse Rate: 65 SpO2: 98 %   Anthropometric Measurements Height: 5\' 4"  (1.626 m) Weight: 241 lb (109.3 kg) BMI (Calculated): 41.35 Weight at Last Visit: 223 lb Weight Lost Since Last Visit: 0 Weight Gained Since Last Visit: 18 lb Starting Weight: 264 lb Total Weight Loss (lbs): 23 lb (10.4 kg) Peak Weight: 286 lb   Body Composition  Body Fat %: 56.6 % Fat Mass (lbs): 136.6 lbs Muscle Mass (lbs): 99.4 lbs Visceral Fat Rating : 19   Other Clinical Data Fasting: no Labs: no Today's Visit #: 14 Starting Date: 02/24/22     HPI  Chief Complaint: OBESITY  Tammy [name] is here to discuss her progress with her obesity treatment plan. She is on the the Category 1 Plan and states she is following her eating plan approximately 0 % of the time. She states she is exercising [employer] 60 minutes 3 times per week.  Discussed the use of AI scribe software for clinical note transcription with the patient, who gave verbal consent to proceed.  History of Present Illness  /     Interval History:  Since last office visit she up 18 lbs. She has fallen off of her meal plan since June when she was last in the office.  She underwent knee replacement surgery at the end of April has done well post operatively. She did retire and feels she is eating more out of boredom.   She attributes this to overeating, which she believes is due to having too much time on her hands following retirement. She mentions that she has been eating out frequently due to various celebrations and admits to boredom eating.  Despite this, she has been maintaining her exercise routine at the local gym, where she rides the bike and does arm and leg exercises at least three times a week. She also mentions that she has been doing her laundry, which involves going up and down sixteen steps. The patient  expresses a desire to get back on track with her obesity treatment plan and we discussed journaling with calorie goals of 1000-1100 calories and 75 grams of protein. We discussed using [employer] for recipe ideas.   Saw her PCP at [facility] in August for AWV. - Dr. Docia Chuck.   Pharmacotherapy: None for weight loss  TREATMENT PLAN FOR OBESITY: Patient reports recent indulgence in unhealthy foods and snacks. Expressed desire to get back on track with nutrition. Currently consuming protein drinks. -Encourage focus on protein and hydration. -Recommend protein-based snacks for when feeling hungry. -Advise patient to journal food intake at least 3-4 days a week. -Recommend use of [employer] for low-calorie, high-protein recipe ideas. -Consider use of a protein supplement like Pillars yogurt drink for additional protein intake.  Recommended Dietary Goals  Adraine is currently in the action stage of change. As such, her goal is to continue weight management plan. She has agreed to the Category 1 Plan and keeping a food journal and adhering to recommended goals of 1000-1100 calories and 75 grams of protein.  Behavioral Intervention  We discussed the following Behavioral Modification Strategies today: increasing lean protein intake, decreasing simple carbohydrates , increasing vegetables, increasing lower glycemic fruits, increasing fiber rich foods, increasing water intake, work on meal planning and preparation, work on Counselling psychologist calories using tracking application, emotional eating strategies and understanding the difference between hunger signals and cravings,  continue to practice mindfulness when eating, and planning for success.  Additional resources provided today: 100 calorie protein snack options      Journaling logs  Recommended Physical Activity Goals  Amariya has been advised to work up to 150 minutes of moderate intensity aerobic activity a week and  strengthening exercises 2-3 times per week for cardiovascular health, weight loss maintenance and preservation of muscle mass.   She has agreed to Continue current level of physical activity  and think about adding a weighted vest    Pharmacotherapy We discussed various medication options to help [place] with her weight loss efforts and we both agreed to continue to work on nutritional and behavioral strategies to promote weight loss.      Return in about 4 weeks (around 02/02/2023).[name] She was informed of the importance of frequent follow up visits to maximize her success with intensive lifestyle modifications for her multiple health conditions.  PHYSICAL EXAM:  Blood pressure 123/74, pulse 65, temperature 97.9 F (36.6 C), height 5\' 4"  (1.626 m), weight 241 lb (109.3 kg), SpO2 98%. Body mass index is 41.37 kg/m.  General: She is overweight, cooperative, alert, well developed, and in no acute distress. PSYCH: Has normal mood, affect and thought process.   Cardiovascular: HR 60's regular, BP 123/74 Lungs: Normal breathing effort, no conversational dyspnea. Neuro: no focal deficits  DIAGNOSTIC DATA REVIEWED:  BMET    Component Value Date/Time   NA 135 08/13/2022 0331   NA 142 02/24/2022 1132   K 4.4 08/13/2022 0331   CL 100 08/13/2022 0331   CO2 26 08/13/2022 0331   GLUCOSE 151 (H) 08/13/2022 0331   BUN 13 08/13/2022 0331   BUN 22 02/24/2022 1132   CREATININE 0.61 08/13/2022 0331   CALCIUM 8.4 (L) 08/13/2022 0331   GFRNONAA >60 08/13/2022 0331   GFRAA >60 10/23/2017 0514   Lab Results  Component Value Date   HGBA1C 5.2 08/05/2022   HGBA1C 5.5 02/24/2022   Lab Results  Component Value Date   INSULIN 9.6 02/24/2022   Lab Results  Component Value Date   TSH 1.220 02/24/2022   CBC    Component Value Date/Time   WBC 10.9 (H) 08/13/2022 0331   RBC 3.97 08/13/2022 0331   HGB 12.5 08/13/2022 0331   HGB 14.3 02/24/2022 1132   HCT 37.8 08/13/2022 0331   HCT 42.7  02/24/2022 1132   PLT 156 08/13/2022 0331   PLT 232 02/24/2022 1132   MCV 95.2 08/13/2022 0331   MCV 95 02/24/2022 1132   MCH 31.5 08/13/2022 0331   MCHC 33.1 08/13/2022 0331   RDW 13.2 08/13/2022 0331   RDW 13.5 02/24/2022 1132   Iron Studies No results found for: "IRON", "TIBC", "FERRITIN", "IRONPCTSAT" Lipid Panel     Component Value Date/Time   CHOL 149 02/24/2022 1132   TRIG 130 02/24/2022 1132   HDL 52 02/24/2022 1132   LDLCALC 74 02/24/2022 1132   Hepatic Function Panel     Component Value Date/Time   PROT 7.4 08/05/2022 1415   PROT 6.2 02/24/2022 1132   ALBUMIN 4.2 08/05/2022 1415   ALBUMIN 4.0 02/24/2022 1132   AST 23 08/05/2022 1415   ALT 21 08/05/2022 1415   ALKPHOS 52 08/05/2022 1415   BILITOT 1.0 08/05/2022 1415   BILITOT 0.4 02/24/2022 1132      Component Value Date/Time   TSH 1.220 02/24/2022 1132   Nutritional Lab Results  Component Value Date   VD25OH 26.8 (L) 02/24/2022  ASSOCIATED CONDITIONS ADDRESSED TODAY  ASSESSMENT AND PLAN  Problem List Items Addressed This Visit     S/P knee replacement   Morbid obesity (HCC)   Vitamin D deficiency   BMI 40.0-44.9, adult (HCC) Current BMI 40.7   Insulin resistance - Primary   Insulin Resistance Last fasting insulin was 9.6- not at goal. A1c was 5.3- stable and at goal at [facility] on 12/04/22. Polyphagia:Yes Medication(s): None She is working to get back on track with her nutrition plan to decrease simple carbohydrates, increase lean proteins and exercise to promote weight loss, improve glycemic control and prevent progression to Type 2 diabetes.   Lab Results  Component Value Date   HGBA1C 5.2 08/05/2022   HGBA1C 5.5 02/24/2022   Lab Results  Component Value Date   INSULIN 9.6 02/24/2022    Plan:  Resume working on nutrition plan to decrease simple carbohydrates, increase lean proteins and exercise to promote weight loss, improve glycemic control and prevent progression to Type 2 diabetes.    Vitamin D Deficiency Vitamin D is not at goal of 50.  Most recent vitamin D level was 26.8. She is on OTC vitamin D3 5000 IU daily.and 10K units on Fri/Sat/Sun. No N/V or muscle weakness with vitamin D.  Lab Results  Component Value Date   VD25OH 26.8 (L) 02/24/2022    Plan: Continue OTC vitamin D.  Low vitamin D levels can be associated with adiposity and may result in leptin resistance and weight gain. Also associated with fatigue. Currently on vitamin D supplementation without any adverse effects.  Recheck vitamin D level over the next 1-2 months.      S/P Left knee replacement surgery/Post-operative recovery and muscle mass Patient reports good recovery after knee surgery on 08/16/2022. Increased muscle mass noted since last visit, but also increased adipose tissue. Patient is active, doing laundry and exercising at the gym three times a week. -Encourage continuation of current exercise regimen. -Consider use of a weighted vest to increase muscle mass.   General Health Maintenance Recent labs from wellness visit in August 2024 show normal cholesterol, TSH, and A1C levels. No need for repeat labs at this time. -Schedule follow-up appointment for 02/03/2023 at 4:15pm. ATTESTASTION STATEMENTS:  Reviewed by clinician on day of visit: allergies, medications, problem list, medical history, surgical history, family history, social history, and previous encounter notes.   I have personally spent 40 minutes total time today in preparation, patient care, nutritional counseling and documentation for this visit, including the following: review of clinical lab tests; review of medical tests/procedures/services.      Esma Kilts, PA-C

## 2023-01-21 DIAGNOSIS — R0981 Nasal congestion: Secondary | ICD-10-CM | POA: Diagnosis not present

## 2023-01-21 DIAGNOSIS — Z6841 Body Mass Index (BMI) 40.0 and over, adult: Secondary | ICD-10-CM | POA: Diagnosis not present

## 2023-01-21 DIAGNOSIS — Z03818 Encounter for observation for suspected exposure to other biological agents ruled out: Secondary | ICD-10-CM | POA: Diagnosis not present

## 2023-01-21 DIAGNOSIS — J3489 Other specified disorders of nose and nasal sinuses: Secondary | ICD-10-CM | POA: Diagnosis not present

## 2023-02-02 DIAGNOSIS — Z4789 Encounter for other orthopedic aftercare: Secondary | ICD-10-CM | POA: Diagnosis not present

## 2023-02-03 ENCOUNTER — Ambulatory Visit (INDEPENDENT_AMBULATORY_CARE_PROVIDER_SITE_OTHER): Payer: PPO | Admitting: Physician Assistant

## 2023-02-03 ENCOUNTER — Encounter (INDEPENDENT_AMBULATORY_CARE_PROVIDER_SITE_OTHER): Payer: Self-pay | Admitting: Physician Assistant

## 2023-02-03 VITALS — BP 111/77 | HR 68 | Temp 97.7°F | Ht 64.0 in | Wt 233.0 lb

## 2023-02-03 DIAGNOSIS — Z6839 Body mass index (BMI) 39.0-39.9, adult: Secondary | ICD-10-CM

## 2023-02-03 DIAGNOSIS — E559 Vitamin D deficiency, unspecified: Secondary | ICD-10-CM

## 2023-02-03 DIAGNOSIS — E88819 Insulin resistance, unspecified: Secondary | ICD-10-CM | POA: Diagnosis not present

## 2023-02-03 NOTE — Progress Notes (Unsigned)
.smr  Office: [phone]  /  Fax: [phone]  WEIGHT SUMMARY AND BIOMETRICS  Vitals Temp: 97.7 F (36.5 C) BP: 111/77 Pulse Rate: 68 SpO2: 95 %   Anthropometric Measurements Height: 5\' 4"  (1.626 m) Weight: 233 lb (105.7 kg) BMI (Calculated): 39.97 Weight at Last Visit: 241 lb Weight Lost Since Last Visit: 8 lb Weight Gained Since Last Visit: 0 Starting Weight: 264 lb Total Weight Loss (lbs): 31 lb (14.1 kg) Peak Weight: 286 lb   Body Composition  Body Fat %: 56 % Fat Mass (lbs): 130.8 lbs Muscle Mass (lbs): 97.6 lbs Visceral Fat Rating : 19   Other Clinical Data Fasting: no Labs: no Today's Visit #: 15 Starting Date: 02/24/22     HPI  Chief Complaint: OBESITY  Kaitrin is here to discuss her progress with her obesity treatment plan. She is on the {MWMwtlossportion/plan2:23431} and states she is following her eating plan approximately *** % of the time. She states she is exercising *** minutes *** times per week.   Interval History:  Since last office visit she *** Hunger/appetite-*** Cravings- *** Stress- *** Sleep- *** Exercise-*** Hydration-***   Pharmacotherapy: ***  TREATMENT PLAN FOR OBESITY:  Recommended Dietary Goals  Wen is currently in the action stage of change. As such, her goal is to continue weight management plan. She has agreed to {MWMwtlossportion/plan2:23431}.  Behavioral Intervention  We discussed the following Behavioral Modification Strategies today: {EMWMwtlossstrategies:28914::"continue to work on maintaining a reduced calorie state, getting the recommended amount of protein, incorporating whole foods, making healthy choices, staying well hydrated and practicing mindfulness when eating."}.  Additional resources provided today: NA  Recommended Physical Activity Goals  Delaynie has been advised to work up to 150 minutes of moderate intensity aerobic activity a week and strengthening exercises 2-3 times per week for  cardiovascular health, weight loss maintenance and preservation of muscle mass.   She has agreed to {EMEXERCISE:28847::"Think about enjoyable ways to increase daily physical activity and overcoming barriers to exercise","Increase physical activity in their day and reduce sedentary time (increase NEAT)."}   Pharmacotherapy We discussed various medication options to help [facility] with her weight loss efforts and we both agreed to ***.    No follow-ups on file.[name] She was informed of the importance of frequent follow up visits to maximize her success with intensive lifestyle modifications for her multiple health conditions.  PHYSICAL EXAM:  Blood pressure 111/77, pulse 68, temperature 97.7 F (36.5 C), height 5\' 4"  (1.626 m), weight 233 lb (105.7 kg), SpO2 95%. Body mass index is 39.99 kg/m.  General: She is overweight, cooperative, alert, well developed, and in no acute distress. PSYCH: Has normal mood, affect and thought process.   Lungs: Normal breathing effort, no conversational dyspnea.  DIAGNOSTIC DATA REVIEWED:  BMET    Component Value Date/Time   NA 135 08/13/2022 0331   NA 142 02/24/2022 1132   K 4.4 08/13/2022 0331   CL 100 08/13/2022 0331   CO2 26 08/13/2022 0331   GLUCOSE 151 (H) 08/13/2022 0331   BUN 13 08/13/2022 0331   BUN 22 02/24/2022 1132   CREATININE 0.61 08/13/2022 0331   CALCIUM 8.4 (L) 08/13/2022 0331   GFRNONAA >60 08/13/2022 0331   GFRAA >60 10/23/2017 0514   Lab Results  Component Value Date   HGBA1C 5.2 08/05/2022   HGBA1C 5.5 02/24/2022   Lab Results  Component Value Date   INSULIN 9.6 02/24/2022   Lab Results  Component Value Date   TSH 1.220 02/24/2022  CBC    Component Value Date/Time   WBC 10.9 (H) 08/13/2022 0331   RBC 3.97 08/13/2022 0331   HGB 12.5 08/13/2022 0331   HGB 14.3 02/24/2022 1132   HCT 37.8 08/13/2022 0331   HCT 42.7 02/24/2022 1132   PLT 156 08/13/2022 0331   PLT 232 02/24/2022 1132   MCV 95.2 08/13/2022 0331    MCV 95 02/24/2022 1132   MCH 31.5 08/13/2022 0331   MCHC 33.1 08/13/2022 0331   RDW 13.2 08/13/2022 0331   RDW 13.5 02/24/2022 1132   Iron Studies No results found for: "IRON", "TIBC", "FERRITIN", "IRONPCTSAT" Lipid Panel     Component Value Date/Time   CHOL 149 02/24/2022 1132   TRIG 130 02/24/2022 1132   HDL 52 02/24/2022 1132   LDLCALC 74 02/24/2022 1132   Hepatic Function Panel     Component Value Date/Time   PROT 7.4 08/05/2022 1415   PROT 6.2 02/24/2022 1132   ALBUMIN 4.2 08/05/2022 1415   ALBUMIN 4.0 02/24/2022 1132   AST 23 08/05/2022 1415   ALT 21 08/05/2022 1415   ALKPHOS 52 08/05/2022 1415   BILITOT 1.0 08/05/2022 1415   BILITOT 0.4 02/24/2022 1132      Component Value Date/Time   TSH 1.220 02/24/2022 1132   Nutritional Lab Results  Component Value Date   VD25OH 26.8 (L) 02/24/2022    ASSOCIATED CONDITIONS ADDRESSED TODAY  ASSESSMENT AND PLAN  Problem List Items Addressed This Visit   None   ATTESTASTION STATEMENTS:  Reviewed by clinician on day of visit: allergies, medications, problem list, medical history, surgical history, family history, social history, and previous encounter notes.   I have personally spent 30 minutes total time today in preparation, patient care, nutritional counseling and documentation for this visit, including the following: review of clinical lab tests; review of medical tests/procedures/services.      Jolayne Branson, PA-C
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
## 2023-03-08 ENCOUNTER — Ambulatory Visit (INDEPENDENT_AMBULATORY_CARE_PROVIDER_SITE_OTHER): Payer: PPO | Admitting: Physician Assistant

## 2023-03-08 ENCOUNTER — Encounter (INDEPENDENT_AMBULATORY_CARE_PROVIDER_SITE_OTHER): Payer: Self-pay | Admitting: Physician Assistant

## 2023-03-08 VITALS — BP 97/51 | HR 67 | Temp 98.2°F | Ht 64.0 in | Wt 227.0 lb

## 2023-03-08 DIAGNOSIS — Z96652 Presence of left artificial knee joint: Secondary | ICD-10-CM

## 2023-03-08 DIAGNOSIS — Z6839 Body mass index (BMI) 39.0-39.9, adult: Secondary | ICD-10-CM | POA: Diagnosis not present

## 2023-03-08 DIAGNOSIS — R031 Nonspecific low blood-pressure reading: Secondary | ICD-10-CM

## 2023-03-08 DIAGNOSIS — E88819 Insulin resistance, unspecified: Secondary | ICD-10-CM | POA: Diagnosis not present

## 2023-03-08 NOTE — Progress Notes (Signed)
SUBJECTIVE:  Chief Complaint: Obesity  Interim History: Tammy [name], a 67 year old patient with a history of insulin resistance, vitamin D deficiency, and obesity, underwent a left total knee replacement in April of this year, following a previous right total knee replacement. The patient experienced some weight regain following the surgery. The patient reports difficulty maintaining a healthy diet over the weekends, particularly due to food served at church events. Despite these challenges, the patient has been mindful of her food choices, opting for low-calorie snacks and increasing water intake.  The patient recently ceased donating plasma, which may have contributed to a decrease in blood pressure. The patient is due to start a new job at the [employer], which will require a significant amount of standing. Despite the physical demands of the job, the patient reports no issues with her knees post-surgery.  The patient has been successful in managing her weight, with an increase in muscle mass and a decrease in adipose tissue. This success is attributed to a diet high in protein and low in carbohydrates. The patient has also been mindful of her fluid intake, ensuring she consumes at least 80 ounces of fluid a day.  The patient has been experiencing nocturia, waking up every two and a half hours to urinate. Despite this, the patient reports sleeping well most of the time. The patient's blood pressure has been on the lower side, but she does not report feeling lightheaded or any other symptoms of concern. The patient's heart rate at rest is within the normal range. The patient's last colonoscopy was three years ago, with no significant findings. The patient has not noticed any bleeding or other signs of GI tract issues.  The patient has been diligent in journaling her food intake, which she finds helpful in maintaining her diet and managing her weight. The patient is also preparing for  a busy holiday season, with multiple concerts to attend. Despite these upcoming events, the patient is committed to sticking to her nutrition plan.  [name] is here to discuss her progress with her obesity treatment plan. She is on the Category 1 Plan and keeping a food journal and adhering to recommended goals of 1000-1100 calories and 75 grams of  protein and states she is following her eating plan approximately 85-90 % of the time. She states she is exercising stationary bike/arm bike 45 minutes 2 times per week.  Pharmacotherapy: none for weight loss.   OBJECTIVE: Visit Diagnoses: Problem List Items Addressed This Visit     S/P knee replacement   Morbid obesity (HCC)   BMI 39.0-39.9,adult   Insulin resistance - Primary   Other Visit Diagnoses     Low blood pressure reading         Obesity 67 year old with recent weight regain post-knee replacement, now showing improvement with a 3.4-pound muscle mass increase and 10.2-pound adipose tissue decrease!  Mindful of carbohydrate and protein intake, utilizing protein drinks and healthy snacks.  Starting a new job, which may impact energy levels and dietary habits. - Continue current dietary strategies focusing on protein intake. - Encourage mindful eating, especially during social events. - Monitor weight and muscle mass regularly.  Insulin Resistance: On no medications.  Lab Results  Component Value Date   HGBA1C 5.2 08/05/2022   HGBA1C 5.5 02/24/2022   Lab Results  Component Value Date   LDLCALC 74 02/24/2022   CREATININE 0.61 08/13/2022   INSULIN  Date Value Ref Range Status  02/24/2022 9.6 2.6 - 24.9 uIU/mL  Final  She is working on a nutrition plan to decrease simple carbohydrates, increase lean proteins and exercise to promote weight loss, improve glycemic control and prevent progression to Type 2 diabetes.   Plan: Continue working on nutrition plan to decrease simple carbohydrates, increase lean proteins and exercise  to promote weight loss, improve glycemic control and prevent progression to Type 2 diabetes.  Insulin resistance managed as part of obesity treatment plan. Will plan to recheck labs over the next several months as continues to work on improved adherence to plan.   Low BP reading:  Blood pressure recorded at 97/51 mmHg.  Previously attributed to plasma donation, which has ceased. No symptoms of lightheadedness or tachycardia. Discussed management through monitoring and adequate fluid intake. No history of GI concerns. Last colonoscopy in 2021 without polyps and patient reports no ongoing concerns.  - Monitor blood pressure regularly. Continue to hydrate well.  - Reassess blood pressure at the next visit. If remains low may need further evaluation.   Post-Total Knee Replacement Bilateral total knee replacements with no current issues reported. L TKA in 07/2022.  Reports no issues following TKA in April 2024. - Continue regular follow-up with orthopedic surgeon as needed.  General Health Maintenance Good overall health, managing conditions well. Mindful of fluid intake and bowel movements. History of lymphocytic colitis on last colonoscopy in 2021 without current symptoms. Discussed importance of adequate sleep for weight management and overall health. - Encourage adequate fluid intake (at least 80 ounces per day). - Continue food journaling to maintain accountability. - Schedule a follow-up appointment in early January.  Follow-up - Schedule follow-up appointment for  mid December and January scheduled.   Vitals Temp: 98.2 F (36.8 C) BP: (!) 97/51 Pulse Rate: 67 SpO2: 97 %   Anthropometric Measurements Height: 5\' 4"  (1.626 m) Weight: 227 lb (103 kg) BMI (Calculated): 38.95 Weight at Last Visit: 233 lb Weight Lost Since Last Visit: 6 lb Weight Gained Since Last Visit: 0 Starting Weight: 227 lb Total Weight Loss (lbs): 37 lb (16.8 kg) Peak Weight: 286 lb   Body Composition   Body Fat %: 53.1 % Fat Mass (lbs): 120.6 lbs Muscle Mass (lbs): 101 lbs Visceral Fat Rating : 17   Other Clinical Data Fasting: no Labs: no Today's Visit #: 16 Starting Date: 02/24/22     ASSESSMENT AND PLAN:  Diet: Tianne is currently in the action stage of change. As such, her goal is to continue with weight loss efforts. She has agreed to Category 1 Plan and keeping a food journal and adhering to recommended goals of 1000-1100 calories and 75 grams of protein.  Exercise: Zyair has been instructed to work up to a goal of 150 minutes of combined cardio and strengthening exercise per week for weight loss and overall health benefits.   Behavior Modification:  We discussed the following Behavioral Modification Strategies today: increasing lean protein intake, decreasing simple carbohydrates, increasing vegetables, increase H2O intake, increase high fiber foods, no skipping meals, and holiday eating strategies. We discussed various medication options to help [facility] with her weight loss efforts and we both agreed to continue to work on nutritional and behavioral strategies to promote weight loss.  .  Return in about 4 weeks (around 04/05/2023).[name] She was informed of the importance of frequent follow up visits to maximize her success with intensive lifestyle modifications for her multiple health conditions.  Attestation Statements:   Reviewed by clinician on day of visit: allergies, medications, problem list, medical history, surgical  history, family history, social history, and previous encounter notes.   Time spent on visit including pre-visit chart review and post-visit care and charting was 42 minutes.    Rhonin Trott, PA-C

## 2023-04-05 ENCOUNTER — Ambulatory Visit (INDEPENDENT_AMBULATORY_CARE_PROVIDER_SITE_OTHER): Payer: PPO | Admitting: Physician Assistant

## 2023-04-05 DIAGNOSIS — L600 Ingrowing nail: Secondary | ICD-10-CM | POA: Diagnosis not present

## 2023-04-05 NOTE — Progress Notes (Deleted)
   SUBJECTIVE:  Chief Complaint: Obesity  Interim History: ***  Tammy [name] is here to discuss her progress with her obesity treatment plan. She is on the {HWW Weight Loss Plan:210964005} and states she {CHL AMB IS/IS NOT:210130109} following her eating plan approximately *** % of the time. She states she {CHL AMB IS/IS NOT:210130109} exercising *** minutes *** times per week.   OBJECTIVE: Visit Diagnoses: Problem List Items Addressed This Visit     S/P knee replacement   Morbid obesity (HCC)   Vitamin D deficiency   Insulin resistance - Primary    No data recorded No data recorded No data recorded No data recorded   ASSESSMENT AND PLAN:  Diet: Tammy [name] {CHL AMB IS/IS NOT:210130109} currently in the action stage of change. As such, her goal is to {HWW Weight Loss Efforts:210964006}. She {HAS HAS PIR:51884} agreed to {HWW Weight Loss Plan:210964005}.  Exercise: Tammy [name] has been instructed {HWW Exercise:210964007} for weight loss and overall health benefits.   Behavior Modification:  We discussed the following Behavioral Modification Strategies today: {HWW Behavior Modification:210964008}. We discussed various medication options to help Tammy [name] with her weight loss efforts and we both agreed to ***.  No follow-ups on file.[name] She was informed of the importance of frequent follow up visits to maximize her success with intensive lifestyle modifications for her multiple health conditions.  Attestation Statements:   Reviewed by clinician on day of visit: allergies, medications, problem list, medical history, surgical history, family history, social history, and previous encounter notes.   Time spent on visit including pre-visit chart review and post-visit care and charting was *** minutes.    Tammy Kirshner, PA-C

## 2023-05-03 ENCOUNTER — Ambulatory Visit (INDEPENDENT_AMBULATORY_CARE_PROVIDER_SITE_OTHER): Payer: PPO | Admitting: Physician Assistant

## 2023-05-28 ENCOUNTER — Other Ambulatory Visit (HOSPITAL_BASED_OUTPATIENT_CLINIC_OR_DEPARTMENT_OTHER): Payer: Self-pay | Admitting: Certified Nurse Midwife

## 2023-06-07 ENCOUNTER — Ambulatory Visit (INDEPENDENT_AMBULATORY_CARE_PROVIDER_SITE_OTHER): Payer: HMO | Admitting: Physician Assistant

## 2023-07-05 ENCOUNTER — Ambulatory Visit (INDEPENDENT_AMBULATORY_CARE_PROVIDER_SITE_OTHER): Payer: HMO | Admitting: Physician Assistant

## 2023-07-05 ENCOUNTER — Encounter (INDEPENDENT_AMBULATORY_CARE_PROVIDER_SITE_OTHER): Payer: Self-pay | Admitting: Physician Assistant

## 2023-07-05 VITALS — BP 129/70 | HR 81 | Temp 98.8°F | Ht 64.0 in | Wt 247.0 lb

## 2023-07-05 DIAGNOSIS — E559 Vitamin D deficiency, unspecified: Secondary | ICD-10-CM

## 2023-07-05 DIAGNOSIS — F439 Reaction to severe stress, unspecified: Secondary | ICD-10-CM

## 2023-07-05 DIAGNOSIS — F3289 Other specified depressive episodes: Secondary | ICD-10-CM

## 2023-07-05 DIAGNOSIS — E88819 Insulin resistance, unspecified: Secondary | ICD-10-CM

## 2023-07-05 DIAGNOSIS — Z6841 Body Mass Index (BMI) 40.0 and over, adult: Secondary | ICD-10-CM | POA: Diagnosis not present

## 2023-07-05 DIAGNOSIS — F32A Depression, unspecified: Secondary | ICD-10-CM

## 2023-07-05 NOTE — Progress Notes (Signed)
 SUBJECTIVE: Discussed the use of AI scribe software for clinical note transcription with the patient, who gave verbal consent to proceed.  Chief Complaint: Obesity  Interim History: She is up 20 lbs since her last visit in November 2024.    She wants to get back on track with her plan.   [name] is here to discuss her progress with her obesity treatment plan. She is on the Category 1 Plan and states she is not following her eating plan approximately 0 % of the time. She states she is not exercising 0 minutes 0 times per week. The patient is a 68 year old with obesity who presents for follow-up of her obesity treatment plan.  She was last seen at [facility] in November of last year but was lost to follow-up. Since her last visit, she has gained 20 pounds, with some of the weight being muscle but a significant portion being adipose. She has not been following a nutrition plan through the holidays and into early spring.  Increased eating during the holiday season, including Thanksgiving, Christmas, and [employer], as well as frequent dining out after church, has contributed to her weight gain. She eats out of stress rather than boredom and has been eating out more often due to her work schedule. She sometimes skips meals during the day, which may contribute to her weight gain by over eating at other time and making poorer choices. 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 She reports stress related to her job and the holiday season.  She typically drinks a protein shake for breakfast and has been taking [employer] tuna for lunch. She has built 2.6 pounds of  muscle from increased activity at work but acknowledges an increase in adipose tissue. She has been doing better with water intake due to free water availability at work.  She is on 10,000 IU of vitamin D and a multivitamin. She reports sleeping well, but often takes a nap after work.  Fasting labs next OV.  OBJECTIVE: Visit Diagnoses: Problem List Items Addressed This Visit     Morbid obesity (HCC)   Vitamin D deficiency   Depression   BMI 40.0-44.9, adult (HCC) Current BMI 40.7   Insulin resistance - Primary  Obesity Tammy [name], a 68 year old female, has gained 20 pounds since her last visit, primarily adipose tissue, due to non-adherence to her nutrition plan, particularly during the holiday season and stress from a new job. Increased physical activity has resulted in some muscle gain.  The focus is on resuming her weight management plan, emphasizing the importance of not skipping meals to prevent adipose tissue storage and managing stress-related eating. - Educate on healthy eating strategies, including avoiding late-night dining and considering convenient options like Tammy [name] prepared meats. - Encourage protein shakes and easy-to-prepare meals like Tammy [name] options for lunch for breakfast rather than skipping meals. - Advise on healthy snacks to prevent comfort eating, such as vegetables with ranch dip or tzatziki, low-fat string cheese, and jerky. - Emphasize protein intake and hydration. - Plan monthly follow-up visits to monitor progress.   Insulin resistance Last A1c was 5.2 / Insulin 9.6- not at goal.  Medication(s): None  Polyphagia:Yes Lab Results  Component Value Date   HGBA1C 5.2 08/05/2022   HGBA1C 5.5 02/24/2022   Lab Results  Component Value Date   INSULIN 9.6 02/24/2022    Plan:  Continue working on nutrition plan to decrease simple carbohydrates, increase lean proteins and exercise to promote weight loss, improve glycemic control and prevent  progression to Type 2 diabetes.  Will plan to check fasting labs next visit.   Stress management Increased stress from her new job at a [employer] is contributing to weight gain and poor eating habits. 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 - Discuss stress management strategies, focusing on protein intake and healthy eating habits. - Encourage stress reduction methods, including transitioning to a cashier role in April.  Vitamin D deficiency Currently on 5000 units Monday-Thursday and 10,000 international units  Friday-Sunday weekly of vitamin D. Plan to assess vitamin D levels during the next visit to ensure adequacy. - Schedule fasting labs on Monday, April 14th at 12 PM to assess vitamin D levels and other parameters. - Continue current vitamin D supplementation.  Vitals Temp: 98.8 F (37.1 C) BP: 129/70 Pulse Rate: 81 SpO2: 99 %   Anthropometric Measurements Height: 5\' 4"  (1.626 m) Weight: 247 lb (112 kg) BMI (Calculated): 42.38 Weight at Last Visit: 227 lb Weight Lost Since Last Visit: 0 Weight Gained Since Last Visit: 20 Starting Weight: 227 lb Total Weight Loss (lbs): 17 lb (7.711 kg)   Body Composition  Body Fat %: 55.9 % Fat Mass (lbs): 138.6 lbs Muscle Mass (lbs): 103.6 lbs Total Body Water (lbs): 19 lbs   Other Clinical Data Fasting: no Labs: no Today's Visit #: 17 Starting Date: 02/24/22     ASSESSMENT AND PLAN:  Diet: Tammy [name] is currently in the action stage of change. As such, her goal is to continue with weight loss efforts and has agreed to the Category 1 Plan.   Exercise:  Older adults should follow the adult guidelines. When older adults cannot meet the adult guidelines, they should be as physically active as their abilities and conditions will allow., Older adults should do exercises that maintain or improve balance if they are at risk of falling. , and Older adults should determine their level of  effort for physical activity relative to their level of fitness.  Behavior Modification:  We discussed the following Behavioral Modification Strategies today: increasing lean protein intake, decreasing simple carbohydrates, increasing vegetables, increase H2O intake, increase high fiber foods, decreasing eating out, meal planning and cooking strategies, keeping healthy foods in the home, better snacking choices, emotional eating strategies , family/coworker sabotage, avoiding temptations, and planning for success. We discussed various medication options to help [facility] with her weight loss efforts and we both agreed to resume working on nutritional and behavioral strategies to promote weight loss.  .  Return in about 4 weeks (around 08/02/2023).[name] She was informed of the importance of frequent follow up visits to maximize her success with intensive lifestyle modifications for her multiple health conditions.  Attestation Statements:   Reviewed by clinician on day of visit: allergies, medications, problem list, medical history, surgical history, family history, social history, and previous encounter notes.   Time spent on visit including pre-visit chart review and post-visit care and charting was 39 minutes  Rudell Ortman,PA-C

## 2023-08-02 ENCOUNTER — Ambulatory Visit (INDEPENDENT_AMBULATORY_CARE_PROVIDER_SITE_OTHER): Admitting: Physician Assistant

## 2023-08-09 ENCOUNTER — Ambulatory Visit (INDEPENDENT_AMBULATORY_CARE_PROVIDER_SITE_OTHER): Admitting: Physician Assistant

## 2023-08-09 ENCOUNTER — Encounter (INDEPENDENT_AMBULATORY_CARE_PROVIDER_SITE_OTHER): Payer: Self-pay | Admitting: Physician Assistant

## 2023-08-09 VITALS — BP 117/71 | HR 65 | Temp 98.1°F | Ht 64.0 in | Wt 244.0 lb

## 2023-08-09 DIAGNOSIS — E559 Vitamin D deficiency, unspecified: Secondary | ICD-10-CM | POA: Diagnosis not present

## 2023-08-09 DIAGNOSIS — F32A Depression, unspecified: Secondary | ICD-10-CM

## 2023-08-09 DIAGNOSIS — F5089 Other specified eating disorder: Secondary | ICD-10-CM | POA: Diagnosis not present

## 2023-08-09 DIAGNOSIS — E88819 Insulin resistance, unspecified: Secondary | ICD-10-CM

## 2023-08-09 DIAGNOSIS — R5383 Other fatigue: Secondary | ICD-10-CM

## 2023-08-09 DIAGNOSIS — F3289 Other specified depressive episodes: Secondary | ICD-10-CM

## 2023-08-09 DIAGNOSIS — Z6841 Body Mass Index (BMI) 40.0 and over, adult: Secondary | ICD-10-CM | POA: Diagnosis not present

## 2023-08-09 DIAGNOSIS — F439 Reaction to severe stress, unspecified: Secondary | ICD-10-CM | POA: Diagnosis not present

## 2023-08-09 NOTE — Progress Notes (Signed)
 SUBJECTIVE: Discussed the use of AI scribe software for clinical note transcription with the patient, who gave verbal consent to proceed.  Chief Complaint: Obesity  Interim History: She is down 3 lbs since last visit Down 20 lbs total TBW loss of 8.8%  Tammy [name] is here to discuss her progress with her obesity treatment plan. She is on the Category 1 Plan and states she is following her eating plan approximately 0 % of the time. She states she is not exercising.  Tammy [name] is a 68 year old female who presents for follow-up of her obesity treatment plan.  She has a history of insulin  resistance, vitamin D  deficiency, and emotional eating related to stress. Since her last visit, she has lost three pounds but has struggled to adhere closely to her treatment plan due to work-related stressors.  She experiences significant stress at work, which impacts her ability to follow her dietary plan. She works at a [employer], where she is often short-staffed and only gets a 20-minute break during her 7.5-hour shifts. She primarily consumes shakes and water to manage her diet during work hours.  Her energy levels are affected, and she feels 'all over the place' due to the demands of her job. She is also responsible for visiting her sister in a nursing home four times a week, which adds to her stress and limits her time for self-care.  She is currently fasting for lab work, which will include a comprehensive panel to check electrolytes, kidney and liver function, complete blood count, B12, vitamin D , thyroid , lipid panel, A1c, and insulin  levels.  Her current regimen includes shakes as meal replacements, and she is trying to increase her water intake. She has not been able to follow her plan closely but has managed to lose weight, which she attributes to her increased physical activity at work and using protein shakes in order to get adequate protein. We discussed the importance of regular  meals and adequate protein intake for weight loss and overall health. .  Fasting labs were obtained today. The patient was informed we would discuss the lab results at the next visit unless there is a critical issue that needs to be addressed sooner. The patient agreed to keep the next visit at the agreed upon time to discuss these results.    OBJECTIVE: Visit Diagnoses: Problem List Items Addressed This Visit     S/P knee replacement   Morbid obesity (HCC)   Other fatigue   Relevant Orders   Vitamin B12   CBC with Differential/Platelet   TSH   Vitamin D  deficiency   Relevant Orders   VITAMIN D  25 Hydroxy (Vit-D Deficiency, Fractures)   Depression   Insulin  resistance - Primary   Relevant Orders   CMP14+EGFR   Hemoglobin A1c   Insulin , random   Lipid Panel With LDL/HDL Ratio  Obesity Tammy [name], a 68 year old female, is managing obesity with a history of insulin  resistance, vitamin D  deficiency, and stress-related emotional eating.  She lost three pounds since her last visit due to increased physical activity and adherence to protein shakes and water intake. However, work-related stress has hindered strict adherence to her treatment plan.  Emphasized the importance of protein intake and suggested quick meal options to support her diet amidst her busy schedule. Discussed self-care and setting boundaries to manage stress and emotional eating. - Order comprehensive lab tests including electrolytes, kidney and liver function tests, complete blood count, B12, vitamin D , thyroid  panel, lipid panel, A1c,  and insulin  levels. - Encourage continued use of protein shakes, if unable to eat a meal and increased water intake. - Recommend trying [employer] prepared meats as a quick and healthy meal option to meet protein needs. - Discuss the importance of self-care and setting boundaries to manage stress and emotional eating.  Insulin  Resistance Insulin  resistance contributes to her  obesity.  Lab Results  Component Value Date   HGBA1C 5.2 08/05/2022   HGBA1C 5.5 02/24/2022   Lab Results  Component Value Date   LDLCALC 74 02/24/2022   CREATININE 0.61 08/13/2022   INSULIN   Date Value Ref Range Status  02/24/2022 9.6 2.6 - 24.9 uIU/mL Final  ]Continue working on nutrition plan to decrease simple carbohydrates, increase lean proteins and exercise to promote weight loss, improve glycemic control and prevent progression to Type 2 diabetes.  Plans to monitor A1c and insulin  levels to assess her status and adjust treatment accordingly. - Monitor A1c and insulin  levels through lab tests.  Emotional Eating/stress Emotional eating is triggered by stress from work and family responsibilities. Discussed strategies to manage stress and emphasized self-care and setting boundaries to reduce emotional eating. - Encourage setting boundaries with work and family to reduce stress. - Discuss strategies for self-care and stress management.  Vitamin D  Deficiency  On OTC vitamin D  5000 units on Monday-Thursday and 10,000 units on Friday-Sundays.  Last vitamin D  Lab Results  Component Value Date   VD25OH 26.8 (L) 02/24/2022  Low vitamin D  levels can be associated with adiposity and may result in leptin resistance and weight gain. Also associated with fatigue.  Currently on vitamin D  supplementation without any adverse effects such as nausea, vomiting or muscle weakness.  No recent levels checked. - Check vitamin D  levels through lab tests and adjust supplementation accordingly.   Fatigue Endorses fatigue. Plan: Check CBC, B 12, vitamin D , and TSH and treat accordingly.   Follow-up Follow-up is scheduled to review lab results and discuss progress in managing obesity and related conditions. - Schedule follow-up appointment for June 2nd at 2:00 PM to review lab results and discuss progress.  Vitals Temp: 98.1 F (36.7 C) BP: 117/71 Pulse Rate: 65 SpO2: 97  %   Anthropometric Measurements Height: 5\' 4"  (1.626 m) Weight: 244 lb (110.7 kg) BMI (Calculated): 41.86 Weight at Last Visit: 247 lb Weight Lost Since Last Visit: 3 lb Weight Gained Since Last Visit: 0 Starting Weight: 227 lb Total Weight Loss (lbs): 20 lb (9.072 kg) Peak Weight: 286 lb   Body Composition  Body Fat %: 55.9 % Fat Mass (lbs): 136.8 lbs Muscle Mass (lbs): 102.6 lbs Visceral Fat Rating : 19   Other Clinical Data Fasting: yes Labs: yes Today's Visit #: 18 Starting Date: 02/24/22     ASSESSMENT AND PLAN:  Diet: Tammy [name] is currently in the action stage of change. As such, her goal is to continue with weight loss efforts. She has agreed to Category 1 Plan.  Exercise: Tammy [name] has been instructed to try a geriatric exercise plan and that some exercise is better than none for weight loss and overall health benefits.   Behavior Modification:  We discussed the following Behavioral Modification Strategies today: increasing lean protein intake, decreasing simple carbohydrates, increasing vegetables, increase H2O intake, increase high fiber foods, meal planning and cooking strategies, emotional eating strategies , family/coworker sabotage, avoiding temptations, and planning for success.. We discussed various medication options to help Tammy [name] with her weight loss efforts and we both agreed to continue to work on  nutritional and behavioral strategies to promote weight loss.  .  Return in about 6 weeks (around 09/20/2023).Tammy [name] She was informed of the importance of frequent follow up visits to maximize her success with intensive lifestyle modifications for her multiple health conditions.  Attestation Statements:   Reviewed by clinician on day of visit: allergies, medications, problem list, medical history, surgical history, family history, social history, and previous encounter notes.   Time spent on visit including pre-visit chart review and post-visit care and charting  was 46 minutes.    Tammy Meek, PA-C

## 2023-08-10 ENCOUNTER — Encounter (INDEPENDENT_AMBULATORY_CARE_PROVIDER_SITE_OTHER): Payer: Self-pay | Admitting: Physician Assistant

## 2023-08-10 LAB — CBC WITH DIFFERENTIAL/PLATELET
Basophils Absolute: 0 10*3/uL (ref 0.0–0.2)
Basos: 1 %
EOS (ABSOLUTE): 0.3 10*3/uL (ref 0.0–0.4)
Eos: 7 %
Hematocrit: 41.2 % (ref 34.0–46.6)
Hemoglobin: 13.3 g/dL (ref 11.1–15.9)
Immature Grans (Abs): 0 10*3/uL (ref 0.0–0.1)
Immature Granulocytes: 0 %
Lymphocytes Absolute: 1.6 10*3/uL (ref 0.7–3.1)
Lymphs: 37 %
MCH: 31.5 pg (ref 26.6–33.0)
MCHC: 32.3 g/dL (ref 31.5–35.7)
MCV: 98 fL — ABNORMAL HIGH (ref 79–97)
Monocytes Absolute: 0.6 10*3/uL (ref 0.1–0.9)
Monocytes: 14 %
Neutrophils Absolute: 1.8 10*3/uL (ref 1.4–7.0)
Neutrophils: 41 %
Platelets: 178 10*3/uL (ref 150–450)
RBC: 4.22 x10E6/uL (ref 3.77–5.28)
RDW: 13.1 % (ref 11.7–15.4)
WBC: 4.2 10*3/uL (ref 3.4–10.8)

## 2023-08-10 LAB — LIPID PANEL WITH LDL/HDL RATIO
Cholesterol, Total: 169 mg/dL (ref 100–199)
HDL: 58 mg/dL (ref >39)
LDL Chol Calc (NIH): 92 mg/dL (ref 0–99)
LDL/HDL Ratio: 1.6 ratio (ref 0.0–3.2)
Triglycerides: 104 mg/dL (ref 0–149)
VLDL Cholesterol Cal: 19 mg/dL (ref 5–40)

## 2023-08-10 LAB — CMP14+EGFR
ALT: 19 IU/L (ref 0–32)
AST: 20 IU/L (ref 0–40)
Albumin: 4.2 g/dL (ref 3.9–4.9)
Alkaline Phosphatase: 67 IU/L (ref 44–121)
BUN/Creatinine Ratio: 32 — ABNORMAL HIGH (ref 12–28)
BUN: 19 mg/dL (ref 8–27)
Bilirubin Total: 0.6 mg/dL (ref 0.0–1.2)
CO2: 23 mmol/L (ref 20–29)
Calcium: 9.6 mg/dL (ref 8.7–10.3)
Chloride: 106 mmol/L (ref 96–106)
Creatinine, Ser: 0.6 mg/dL (ref 0.57–1.00)
Globulin, Total: 2.7 g/dL (ref 1.5–4.5)
Glucose: 88 mg/dL (ref 70–99)
Potassium: 4.7 mmol/L (ref 3.5–5.2)
Sodium: 144 mmol/L (ref 134–144)
Total Protein: 6.9 g/dL (ref 6.0–8.5)
eGFR: 98 mL/min/{1.73_m2} (ref >59)

## 2023-08-10 LAB — VITAMIN D 25 HYDROXY (VIT D DEFICIENCY, FRACTURES): Vit D, 25-Hydroxy: 70.7 ng/mL (ref 30.0–100.0)

## 2023-08-10 LAB — HEMOGLOBIN A1C
Est. average glucose Bld gHb Est-mCnc: 111 mg/dL
Hgb A1c MFr Bld: 5.5 % (ref 4.8–5.6)

## 2023-08-10 LAB — TSH: TSH: 0.958 u[IU]/mL (ref 0.450–4.500)

## 2023-08-10 LAB — VITAMIN B12: Vitamin B-12: 645 pg/mL (ref 232–1245)

## 2023-08-10 LAB — INSULIN, RANDOM: INSULIN: 12.7 u[IU]/mL (ref 2.6–24.9)

## 2023-08-10 NOTE — Telephone Encounter (Signed)
 Call to patient and left voice mail message about results of Vitamin D  level per [facility] documentation.  Asked for patient to my chart message us  back that she received the message and results.

## 2023-08-24 ENCOUNTER — Ambulatory Visit: Admitting: Podiatry

## 2023-08-24 DIAGNOSIS — I739 Peripheral vascular disease, unspecified: Secondary | ICD-10-CM | POA: Diagnosis not present

## 2023-08-24 DIAGNOSIS — L603 Nail dystrophy: Secondary | ICD-10-CM

## 2023-08-24 DIAGNOSIS — L84 Corns and callosities: Secondary | ICD-10-CM | POA: Diagnosis not present

## 2023-08-24 NOTE — Progress Notes (Signed)
 Patient presents for evaluation and treatment of tenderness around nails feet and hyperkeratotic lesions.   Physical exam:  General appearance: Alert, pleasant, and in no acute distress  Vascular: Pedal pulses: DP palpable bilaterally, PT palpable bilaterally.  Severe edema lower legs bilaterally  Neurological:  No paresthesias or burning noted.  Dermatologic:  Hyperkeratotic lesions hallux left.. Nails thickened and dystrophic 1-5 BL. Skin thinned.  Decreased hair lower limbs BL.  Skin hyperpigmentation lower legs BL.  Musculoskeletal: Toes 2 through 5 bilaterally   Diagnosis: Painful hyperkeratotic lesions feet bilaterally. Dystrophic nails 1-5 BL. PAD/PVD  Q 8  Plan: Debrided hyperkeratotic lesions feet x 1. Debrided dystrophic thickened nails 1 through 5 bilaterally.  Return 3 months

## 2023-08-26 DIAGNOSIS — Z96652 Presence of left artificial knee joint: Secondary | ICD-10-CM | POA: Diagnosis not present

## 2023-09-19 NOTE — Progress Notes (Unsigned)
   SUBJECTIVE: Discussed the use of AI scribe software for clinical note transcription with the patient, who gave verbal consent to proceed.  Chief Complaint: Obesity  Interim History: ***  Tammy [name] is here to discuss her progress with her obesity treatment plan. She is on the {HWW Weight Loss Plan:210964005} and states she {CHL AMB IS/IS NOT:210130109} following her eating plan approximately *** % of the time. She states she {CHL AMB IS/IS NOT:210130109} exercising *** minutes *** times per week.   OBJECTIVE: Visit Diagnoses: Problem List Items Addressed This Visit     S/P knee replacement   Morbid obesity (HCC)   Vitamin D  deficiency   Depression   Insulin  resistance - Primary    No data recorded No data recorded No data recorded No data recorded   ASSESSMENT AND PLAN:  Diet: Aaren {CHL AMB IS/IS NOT:210130109} currently in the action stage of change. As such, her goal is to {HWW Weight Loss Efforts:210964006}. She {HAS HAS GNF:62130} agreed to {HWW Weight Loss Plan:210964005}.  Exercise: Mykira has been instructed {HWW Exercise:210964007} for weight loss and overall health benefits.   Behavior Modification:  We discussed the following Behavioral Modification Strategies today: {HWW Behavior Modification:210964008}. We discussed various medication options to help [facility] with her weight loss efforts and we both agreed to ***.  No follow-ups on file.Aaron Aas She was informed of the importance of frequent follow up visits to maximize her success with intensive lifestyle modifications for her multiple health conditions.  Attestation Statements:   Reviewed by clinician on day of visit: allergies, medications, problem list, medical history, surgical history, family history, social history, and previous encounter notes.   Time spent on visit including pre-visit chart review and post-visit care and charting was *** minutes.    Siyah Mault, PA-C

## 2023-09-20 ENCOUNTER — Encounter (INDEPENDENT_AMBULATORY_CARE_PROVIDER_SITE_OTHER): Payer: Self-pay

## 2023-09-20 ENCOUNTER — Ambulatory Visit (INDEPENDENT_AMBULATORY_CARE_PROVIDER_SITE_OTHER): Admitting: Physician Assistant

## 2023-09-20 DIAGNOSIS — Z96652 Presence of left artificial knee joint: Secondary | ICD-10-CM

## 2023-09-20 DIAGNOSIS — E88819 Insulin resistance, unspecified: Secondary | ICD-10-CM

## 2023-09-20 DIAGNOSIS — F3289 Other specified depressive episodes: Secondary | ICD-10-CM

## 2023-09-20 DIAGNOSIS — E559 Vitamin D deficiency, unspecified: Secondary | ICD-10-CM

## 2023-11-01 ENCOUNTER — Ambulatory Visit: Admitting: Podiatry

## 2023-11-15 ENCOUNTER — Ambulatory Visit: Admitting: Podiatry

## 2023-11-15 ENCOUNTER — Encounter: Payer: Self-pay | Admitting: Podiatry

## 2023-11-15 DIAGNOSIS — M79671 Pain in right foot: Secondary | ICD-10-CM

## 2023-11-15 DIAGNOSIS — B351 Tinea unguium: Secondary | ICD-10-CM | POA: Diagnosis not present

## 2023-11-15 DIAGNOSIS — M79672 Pain in left foot: Secondary | ICD-10-CM | POA: Diagnosis not present

## 2023-11-15 NOTE — Progress Notes (Signed)
 Patient presents for evaluation and treatment of tenderness and some redness around nails feet.  Tenderness around toes with walking and wearing shoes.  Physical exam:  General appearance: Alert, pleasant, and in no acute distress.  Vascular: Pedal pulses: DP 2/4 B/L, PT 2/4 B/L. MIld edema lower legs bilaterally  Neurological:   Dermatologic:  Nails thickened, disfigured, discolored 1-5 BL with subungual debris.  Redness and hypertrophic nail folds along nail folds bilaterally but no signs of drainage or infection.  Musculoskeletal:     Diagnosis: 1. Painful onychomycotic nails 1 through 5 bilaterally. 2. Pain toes 1 through 5 bilaterally.  Plan: Debrided onychomycotic nails 1 through 5 bilaterally.  Return 3 months RFC

## 2023-11-29 ENCOUNTER — Other Ambulatory Visit: Payer: Self-pay | Admitting: Family Medicine

## 2023-11-29 DIAGNOSIS — Z1231 Encounter for screening mammogram for malignant neoplasm of breast: Secondary | ICD-10-CM

## 2023-12-10 DIAGNOSIS — Z0000 Encounter for general adult medical examination without abnormal findings: Secondary | ICD-10-CM | POA: Diagnosis not present

## 2023-12-10 DIAGNOSIS — R7301 Impaired fasting glucose: Secondary | ICD-10-CM | POA: Diagnosis not present

## 2023-12-10 DIAGNOSIS — Z79899 Other long term (current) drug therapy: Secondary | ICD-10-CM | POA: Diagnosis not present

## 2023-12-14 DIAGNOSIS — E7800 Pure hypercholesterolemia, unspecified: Secondary | ICD-10-CM | POA: Diagnosis not present

## 2023-12-14 DIAGNOSIS — R7301 Impaired fasting glucose: Secondary | ICD-10-CM | POA: Diagnosis not present

## 2023-12-14 DIAGNOSIS — E2839 Other primary ovarian failure: Secondary | ICD-10-CM | POA: Diagnosis not present

## 2023-12-14 DIAGNOSIS — Z79899 Other long term (current) drug therapy: Secondary | ICD-10-CM | POA: Diagnosis not present

## 2023-12-14 DIAGNOSIS — Z0000 Encounter for general adult medical examination without abnormal findings: Secondary | ICD-10-CM | POA: Diagnosis not present

## 2023-12-19 ENCOUNTER — Other Ambulatory Visit (HOSPITAL_BASED_OUTPATIENT_CLINIC_OR_DEPARTMENT_OTHER): Payer: Self-pay | Admitting: Family Medicine

## 2023-12-19 DIAGNOSIS — E2839 Other primary ovarian failure: Secondary | ICD-10-CM

## 2023-12-27 ENCOUNTER — Ambulatory Visit

## 2023-12-27 ENCOUNTER — Ambulatory Visit
Admission: RE | Admit: 2023-12-27 | Discharge: 2023-12-27 | Disposition: A | Discharge status: Home / Self Care | Source: Ambulatory Visit | Attending: Family Medicine | Admitting: Family Medicine

## 2023-12-27 DIAGNOSIS — Z1231 Encounter for screening mammogram for malignant neoplasm of breast: Secondary | ICD-10-CM

## 2024-01-20 DIAGNOSIS — H04123 Dry eye syndrome of bilateral lacrimal glands: Secondary | ICD-10-CM | POA: Diagnosis not present

## 2024-01-20 DIAGNOSIS — H26493 Other secondary cataract, bilateral: Secondary | ICD-10-CM | POA: Diagnosis not present

## 2024-01-20 DIAGNOSIS — H43813 Vitreous degeneration, bilateral: Secondary | ICD-10-CM | POA: Diagnosis not present

## 2024-01-24 ENCOUNTER — Ambulatory Visit (INDEPENDENT_AMBULATORY_CARE_PROVIDER_SITE_OTHER): Admitting: Podiatry

## 2024-01-24 ENCOUNTER — Encounter: Payer: Self-pay | Admitting: Podiatry

## 2024-01-24 DIAGNOSIS — B351 Tinea unguium: Secondary | ICD-10-CM

## 2024-01-24 DIAGNOSIS — M79671 Pain in right foot: Secondary | ICD-10-CM | POA: Diagnosis not present

## 2024-01-24 DIAGNOSIS — M79672 Pain in left foot: Secondary | ICD-10-CM

## 2024-01-24 NOTE — Progress Notes (Signed)
 Patient presents for evaluation and treatment of tenderness and some redness around nails feet.  Tenderness around toes with walking and wearing shoes.  Physical exam:  General appearance: Alert, pleasant, and in no acute distress.  Vascular: Pedal pulses: DP 2/4 B/L, PT 2/4 B/L. Mild edema lower legs bilaterally  Neu  Dermatologic:  Nails thickened, disfigured, discolored 1-5 BL with subungual debris.  Redness and hypertrophic nail folds along nail folds bilaterally but no signs of drainage or infection.  Musculoskeletal:     Diagnosis: 1. Painful onychomycotic nails 1 through 5 bilaterally. 2. Pain toes 1 through 5 bilaterally.  Plan: -Debrided onychomycotic nails 1 through 5 bilaterally.  Sharply debrided nails with nail clipper and reduced with a power bur.  Return 3 months RFC

## 2024-01-26 DIAGNOSIS — T148XXA Other injury of unspecified body region, initial encounter: Secondary | ICD-10-CM | POA: Diagnosis not present

## 2024-02-08 DIAGNOSIS — H26492 Other secondary cataract, left eye: Secondary | ICD-10-CM | POA: Diagnosis not present

## 2024-02-26 DIAGNOSIS — Z9842 Cataract extraction status, left eye: Secondary | ICD-10-CM | POA: Diagnosis not present

## 2024-02-26 DIAGNOSIS — R030 Elevated blood-pressure reading, without diagnosis of hypertension: Secondary | ICD-10-CM | POA: Diagnosis not present

## 2024-02-26 DIAGNOSIS — Z6841 Body Mass Index (BMI) 40.0 and over, adult: Secondary | ICD-10-CM | POA: Diagnosis not present

## 2024-02-26 DIAGNOSIS — H5713 Ocular pain, bilateral: Secondary | ICD-10-CM | POA: Diagnosis not present

## 2024-02-28 DIAGNOSIS — H10413 Chronic giant papillary conjunctivitis, bilateral: Secondary | ICD-10-CM | POA: Diagnosis not present

## 2024-03-31 NOTE — Progress Notes (Addendum)
 Tammy [name]                                          MRN: [id number]   05/09/2024   The [facility] Quality Team Specialist reviewed this patient medical record for the purposes of chart review for care gap closure. The following were reviewed: chart review for care gap closure-kidney health evaluation for diabetes:eGFR  and uACR.    [facility] Quality Team

## 2024-04-03 ENCOUNTER — Encounter: Payer: Self-pay | Admitting: Podiatry

## 2024-04-03 ENCOUNTER — Ambulatory Visit: Admitting: Podiatry

## 2024-04-03 DIAGNOSIS — M79672 Pain in left foot: Secondary | ICD-10-CM | POA: Diagnosis not present

## 2024-04-03 DIAGNOSIS — B351 Tinea unguium: Secondary | ICD-10-CM

## 2024-04-03 DIAGNOSIS — M79671 Pain in right foot: Secondary | ICD-10-CM

## 2024-04-03 NOTE — Progress Notes (Signed)
 Patient presents for evaluation and treatment of tenderness and some redness around nails feet.  Tenderness around toes with walking and wearing shoes.  Physical exam:  General appearance: Alert, pleasant, and in no acute distress.  Vascular: Pedal pulses: DP 2/4 B/L, PT 2/4 B/L.  Mild edema lower legs bilaterally.  Capillary refill time immediate bilaterally  Neurologic:  Dermatologic:  Nails thickened, disfigured, discolored 1-5 BL with subungual debris.  Redness and hypertrophic nail folds along nail folds bilaterally but no signs of drainage or infection.  Hyperkeratotic lesion distal third toe right then.  Thin atrophic fat pad plantar aspect foot forefoot bilaterally.  Musculoskeletal:  Toes 2 through 5 bilaterally.  Tenderness under the lesser MTPs bilaterally with walking and palpation.   Diagnosis: 1. Painful onychomycotic nails 1 through 5 bilaterally. 2. Pain toes 1 through 5 bilaterally.  Plan: -Debrided onychomycotic nails 1 through 5 bilaterally.  Sharply debrided nails with nail clipper and reduced with a power bur.  -Dispensed OTC orthotics bilaterally  Return 2 months RFC

## 2024-05-29 ENCOUNTER — Ambulatory Visit (HOSPITAL_BASED_OUTPATIENT_CLINIC_OR_DEPARTMENT_OTHER)

## 2024-06-07 ENCOUNTER — Ambulatory Visit: Admitting: Podiatry
